# Patient Record
Sex: Female | Born: 1970 | Race: White | Hispanic: No | Marital: Married | Smoking: Current every day smoker
Health system: Southern US, Community
[De-identification: ages and names within clinical notes are randomized; demographics above are authoritative.]

## PROBLEM LIST (undated history)

## (undated) DIAGNOSIS — F329 Major depressive disorder, single episode, unspecified: Secondary | ICD-10-CM

## (undated) DIAGNOSIS — F32A Depression, unspecified: Secondary | ICD-10-CM

## (undated) DIAGNOSIS — C4490 Unspecified malignant neoplasm of skin, unspecified: Secondary | ICD-10-CM

## (undated) DIAGNOSIS — F419 Anxiety disorder, unspecified: Secondary | ICD-10-CM

## (undated) HISTORY — PX: ABDOMINAL HYSTERECTOMY: SHX81

## (undated) HISTORY — PX: TONSILLECTOMY: SUR1361

---

## 1997-05-02 ENCOUNTER — Emergency Department (HOSPITAL_COMMUNITY): Admission: EM | Admit: 1997-05-02 | Discharge: 1997-05-02 | Payer: Self-pay | Admitting: Emergency Medicine

## 1997-09-27 ENCOUNTER — Other Ambulatory Visit: Admission: RE | Admit: 1997-09-27 | Discharge: 1997-09-27 | Payer: Self-pay | Admitting: Obstetrics and Gynecology

## 1997-09-28 ENCOUNTER — Other Ambulatory Visit: Admission: RE | Admit: 1997-09-28 | Discharge: 1997-09-28 | Payer: Self-pay | Admitting: Internal Medicine

## 1997-10-05 ENCOUNTER — Ambulatory Visit (HOSPITAL_COMMUNITY): Admission: RE | Admit: 1997-10-05 | Discharge: 1997-10-05 | Payer: Self-pay | Admitting: *Deleted

## 1998-05-23 ENCOUNTER — Encounter: Payer: Self-pay | Admitting: Obstetrics and Gynecology

## 1998-05-23 ENCOUNTER — Ambulatory Visit (HOSPITAL_COMMUNITY): Admission: RE | Admit: 1998-05-23 | Discharge: 1998-05-23 | Payer: Self-pay | Admitting: Obstetrics and Gynecology

## 2005-05-20 ENCOUNTER — Encounter: Payer: Self-pay | Admitting: Obstetrics and Gynecology

## 2005-08-27 ENCOUNTER — Observation Stay: Payer: Self-pay | Admitting: Obstetrics & Gynecology

## 2005-09-24 ENCOUNTER — Ambulatory Visit: Payer: Self-pay | Admitting: Obstetrics & Gynecology

## 2005-10-01 ENCOUNTER — Observation Stay: Payer: Self-pay

## 2005-10-06 ENCOUNTER — Ambulatory Visit: Payer: Self-pay

## 2005-11-10 ENCOUNTER — Observation Stay: Payer: Self-pay | Admitting: Unknown Physician Specialty

## 2005-11-15 ENCOUNTER — Observation Stay: Payer: Self-pay | Admitting: Unknown Physician Specialty

## 2005-11-25 ENCOUNTER — Observation Stay: Payer: Self-pay | Admitting: Obstetrics & Gynecology

## 2005-11-30 ENCOUNTER — Observation Stay: Payer: Self-pay

## 2005-12-06 ENCOUNTER — Inpatient Hospital Stay: Payer: Self-pay | Admitting: Obstetrics & Gynecology

## 2006-05-06 ENCOUNTER — Encounter: Payer: Self-pay | Admitting: Obstetrics and Gynecology

## 2006-05-20 ENCOUNTER — Encounter: Payer: Self-pay | Admitting: Maternal & Fetal Medicine

## 2006-08-02 ENCOUNTER — Encounter: Payer: Self-pay | Admitting: Maternal & Fetal Medicine

## 2006-08-09 ENCOUNTER — Encounter: Payer: Self-pay | Admitting: Maternal & Fetal Medicine

## 2006-08-16 ENCOUNTER — Encounter: Payer: Self-pay | Admitting: Obstetrics and Gynecology

## 2006-08-30 ENCOUNTER — Observation Stay: Payer: Self-pay | Admitting: Obstetrics and Gynecology

## 2006-08-30 ENCOUNTER — Encounter: Payer: Self-pay | Admitting: Maternal & Fetal Medicine

## 2006-09-17 ENCOUNTER — Ambulatory Visit: Payer: Self-pay | Admitting: Unknown Physician Specialty

## 2006-09-21 ENCOUNTER — Ambulatory Visit: Payer: Self-pay

## 2006-09-27 ENCOUNTER — Encounter: Payer: Self-pay | Admitting: Maternal and Fetal Medicine

## 2006-09-28 ENCOUNTER — Inpatient Hospital Stay: Payer: Self-pay | Admitting: Unknown Physician Specialty

## 2008-06-05 ENCOUNTER — Ambulatory Visit: Payer: Self-pay

## 2008-06-12 ENCOUNTER — Ambulatory Visit: Payer: Self-pay

## 2012-05-07 ENCOUNTER — Emergency Department: Payer: Self-pay | Admitting: Emergency Medicine

## 2014-02-08 ENCOUNTER — Emergency Department: Payer: Self-pay | Admitting: Emergency Medicine

## 2014-02-08 LAB — CBC WITH DIFFERENTIAL/PLATELET
Basophil #: 0.1 10*3/uL (ref 0.0–0.1)
Basophil %: 1.1 %
Eosinophil #: 0.1 10*3/uL (ref 0.0–0.7)
Eosinophil %: 1.1 %
HCT: 37.6 % (ref 35.0–47.0)
HGB: 12.4 g/dL (ref 12.0–16.0)
Lymphocyte #: 1.6 10*3/uL (ref 1.0–3.6)
Lymphocyte %: 23.8 %
MCH: 28.4 pg (ref 26.0–34.0)
MCHC: 32.9 g/dL (ref 32.0–36.0)
MCV: 87 fL (ref 80–100)
Monocyte #: 0.4 x10 3/mm (ref 0.2–0.9)
Monocyte %: 6.3 %
Neutrophil #: 4.7 10*3/uL (ref 1.4–6.5)
Neutrophil %: 67.7 %
Platelet: 237 10*3/uL (ref 150–440)
RBC: 4.34 10*6/uL (ref 3.80–5.20)
RDW: 14.5 % (ref 11.5–14.5)
WBC: 6.9 10*3/uL (ref 3.6–11.0)

## 2014-02-08 LAB — COMPREHENSIVE METABOLIC PANEL
Albumin: 3.8 g/dL (ref 3.4–5.0)
Alkaline Phosphatase: 54 U/L
Anion Gap: 6 — ABNORMAL LOW (ref 7–16)
BUN: 12 mg/dL (ref 7–18)
Bilirubin,Total: 0.3 mg/dL (ref 0.2–1.0)
Calcium, Total: 9.1 mg/dL (ref 8.5–10.1)
Chloride: 106 mmol/L (ref 98–107)
Co2: 29 mmol/L (ref 21–32)
Creatinine: 1 mg/dL (ref 0.60–1.30)
EGFR (African American): >60
EGFR (Non-African Amer.): >60
Glucose: 92 mg/dL (ref 65–99)
Osmolality: 281 (ref 275–301)
Potassium: 3.6 mmol/L (ref 3.5–5.1)
SGOT(AST): 23 U/L (ref 15–37)
SGPT (ALT): 20 U/L
Sodium: 141 mmol/L (ref 136–145)
Total Protein: 7.5 g/dL (ref 6.4–8.2)

## 2014-02-08 LAB — URINALYSIS, COMPLETE
Bacteria: NONE SEEN
Bilirubin,UR: NEGATIVE
Glucose,UR: NEGATIVE mg/dL (ref 0–75)
Ketone: NEGATIVE
Leukocyte Esterase: NEGATIVE
Nitrite: NEGATIVE
Ph: 7 (ref 4.5–8.0)
Protein: NEGATIVE
RBC,UR: 3 /HPF (ref 0–5)
Specific Gravity: 1.009 (ref 1.003–1.030)
Squamous Epithelial: <1
WBC UR: <1 /HPF (ref 0–5)

## 2014-02-08 LAB — PREGNANCY, URINE: Pregnancy Test, Urine: NEGATIVE m[IU]/mL

## 2015-08-26 ENCOUNTER — Other Ambulatory Visit: Payer: Self-pay | Admitting: Obstetrics and Gynecology

## 2015-08-26 DIAGNOSIS — Z1231 Encounter for screening mammogram for malignant neoplasm of breast: Secondary | ICD-10-CM

## 2015-09-05 ENCOUNTER — Other Ambulatory Visit: Payer: Self-pay | Admitting: Obstetrics and Gynecology

## 2015-09-05 ENCOUNTER — Ambulatory Visit
Admission: RE | Admit: 2015-09-05 | Discharge: 2015-09-05 | Disposition: A | Discharge status: Home / Self Care | Payer: BC Managed Care – PPO | Source: Ambulatory Visit | Attending: Obstetrics and Gynecology | Admitting: Obstetrics and Gynecology

## 2015-09-05 DIAGNOSIS — Z1231 Encounter for screening mammogram for malignant neoplasm of breast: Secondary | ICD-10-CM | POA: Diagnosis not present

## 2017-01-01 ENCOUNTER — Other Ambulatory Visit: Payer: Self-pay

## 2017-01-01 ENCOUNTER — Ambulatory Visit
Admission: EM | Admit: 2017-01-01 | Discharge: 2017-01-01 | Disposition: A | Discharge status: Home / Self Care | Payer: BC Managed Care – PPO | Attending: Family Medicine | Admitting: Family Medicine

## 2017-01-01 DIAGNOSIS — J0100 Acute maxillary sinusitis, unspecified: Secondary | ICD-10-CM | POA: Diagnosis not present

## 2017-01-01 HISTORY — DX: Unspecified malignant neoplasm of skin, unspecified: C44.90

## 2017-01-01 HISTORY — DX: Major depressive disorder, single episode, unspecified: F32.9

## 2017-01-01 HISTORY — DX: Depression, unspecified: F32.A

## 2017-01-01 HISTORY — DX: Anxiety disorder, unspecified: F41.9

## 2017-01-01 MED ORDER — AMOXICILLIN 875 MG PO TABS: 875.0000 mg | ORAL_TABLET | Freq: Two times a day (BID) | ORAL | 0 refills | Status: DC

## 2017-01-01 NOTE — ED Triage Notes (Signed)
Pt reports 4 days of left sided facial pain and unsure if it coming from her sinus or if it is dental/gums. Denies nasal drainage. Denies fever.

## 2017-01-01 NOTE — ED Provider Notes (Signed)
[facility]    CSN: [id number] Arrival date & time: 01/01/17  1623     History   Chief Complaint Chief Complaint  Patient presents with  . Facial Pain    HPI Hannah Rodgers is a 46 y.o. female.   The history is provided by the patient.  URI  Presenting symptoms: congestion, ear pain and facial pain   Severity:  Moderate Onset quality:  Sudden Duration:  1 week Timing:  Constant Progression:  Worsening Chronicity:  New Relieved by:  Nothing Ineffective treatments:  OTC medications Associated symptoms: headaches and sinus pain   Risk factors: not elderly, no chronic cardiac disease, no chronic kidney disease, no chronic respiratory disease, no immunosuppression, no recent illness and no recent travel     Past Medical History:  Diagnosis Date  . Anxiety   . Depression   . Skin cancer     There are no active problems to display for this patient.   History reviewed. No pertinent surgical history.  OB History    No data available       Home Medications    Prior to Admission medications   Medication Sig Start Date End Date Taking? Authorizing Provider  medroxyPROGESTERone (DEPO-PROVERA) 150 MG/ML injection Inject 150 mg into the muscle every 3 (three) months.   Yes [provider]  sertraline (ZOLOFT) 100 MG tablet Take 150 mg by mouth daily.   Yes [provider]  amoxicillin (AMOXIL) 875 MG tablet Take 1 tablet (875 mg total) by mouth 2 (two) times daily. 01/01/17   Norval Gable, MD    Family History Family History  Problem Relation Age of Onset  . Hypertension Mother   . Hypertension Father     Social History Social History   Tobacco Use  . Smoking status: Current Every Day Smoker    Packs/day: 0.50    Types: Cigarettes  . Smokeless tobacco: Never Used  Substance Use Topics  . Alcohol use: No    Frequency: Never  . Drug use: No     Allergies   Patient has no known allergies.   Review of  Systems Review of Systems  HENT: Positive for congestion, ear pain and sinus pain.   Neurological: Positive for headaches.     Physical Exam Triage Vital Signs ED Triage Vitals  Enc Vitals Group     BP 01/01/17 1637 (!) 157/80     Pulse Rate 01/01/17 1637 72     Resp 01/01/17 1637 16     Temp 01/01/17 1637 98 F (36.7 C)     Temp Source 01/01/17 1637 Oral     SpO2 01/01/17 1637 100 %     Weight 01/01/17 1637 215 lb (97.5 kg)     Height 01/01/17 1637 5\' 6"  (1.676 m)     Head Circumference --      Peak Flow --      Pain Score 01/01/17 1638 6     Pain Loc --      Pain Edu? --      Excl. in [place]? --    No data found.  Updated Vital Signs BP (!) 157/80 (BP Location: Left Arm)   Pulse 72   Temp 98 F (36.7 C) (Oral)   Resp 16   Ht 5\' 6"  (1.676 m)   Wt 215 lb (97.5 kg)   SpO2 100%   BMI 34.70 kg/m   Visual Acuity Right Eye Distance:   Left Eye Distance:   Bilateral  Distance:    Right Eye Near:   Left Eye Near:    Bilateral Near:     Physical Exam  Constitutional: She appears well-developed and well-nourished. No distress.  HENT:  Head: Normocephalic and atraumatic.  Right Ear: Tympanic membrane, external ear and ear canal normal.  Left Ear: Tympanic membrane, external ear and ear canal normal.  Nose: Mucosal edema and rhinorrhea present. No nose lacerations, sinus tenderness, nasal deformity, septal deviation or nasal septal hematoma. No epistaxis.  No foreign bodies. Right sinus exhibits maxillary sinus tenderness and frontal sinus tenderness. Left sinus exhibits maxillary sinus tenderness and frontal sinus tenderness.  Mouth/Throat: Uvula is midline, oropharynx is clear and moist and mucous membranes are normal. No oropharyngeal exudate.  Eyes: Conjunctivae and EOM are normal. Pupils are equal, round, and reactive to light. Right eye exhibits no discharge. Left eye exhibits no discharge. No scleral icterus.  Neck: Normal range of motion. Neck supple. No thyromegaly  present.  Cardiovascular: Normal rate, regular rhythm and normal heart sounds.  Pulmonary/Chest: Effort normal and breath sounds normal. No respiratory distress. She has no wheezes. She has no rales.  Lymphadenopathy:    She has no cervical adenopathy.  Skin: She is not diaphoretic.  Nursing note and vitals reviewed.    UC Treatments / Results  Labs (all labs ordered are listed, but only abnormal results are displayed) Labs Reviewed - No data to display  EKG  EKG Interpretation None       Radiology No results found.  Procedures Procedures (including critical care time)  Medications Ordered in UC Medications - No data to display   Initial Impression / Assessment and Plan / UC Course  I have reviewed the triage vital signs and the nursing notes.  Pertinent labs & imaging results that were available during my care of the patient were reviewed by me and considered in my medical decision making (see chart for details).       Final Clinical Impressions(s) / UC Diagnoses   Final diagnoses:  Acute maxillary sinusitis, recurrence not specified    ED Discharge Orders        Ordered    amoxicillin (AMOXIL) 875 MG tablet  2 times daily     01/01/17 1724     1. diagnosis reviewed with patient 2. rx as per orders above; reviewed possible side effects, interactions, risks and benefits  3. Recommend supportive treatment with otc analgesics prn  4. Follow-up prn if symptoms worsen or don't improve   Controlled Substance Prescriptions [place] Controlled Substance Registry consulted? Not Applicable   Norval Gable, MD 01/01/17 [phone]

## 2018-01-19 DIAGNOSIS — C4490 Unspecified malignant neoplasm of skin, unspecified: Secondary | ICD-10-CM

## 2018-01-19 HISTORY — DX: Unspecified malignant neoplasm of skin, unspecified: C44.90

## 2018-06-20 ENCOUNTER — Other Ambulatory Visit: Payer: BC Managed Care – PPO

## 2018-06-24 NOTE — H&P (Signed)
Hannah Rodgers is a 48 y.o. female presenting with Pre Op Consulting  History of Present Illness: -Patient is here for a pre-op visit regarding her scheduled [place] for treatment of her ongoing AUB. Just slightly nervous patient.  -Body mass index is 37.09 kg/m.   Pertinent Hx: -Hx of AUB for several years, controlled with Depo -Failed progesterone -Is smoker, estrogen maintenance is contraindicated -Declines ablation, desires TLH  Surgical Hx: -C/S x 2, 1 SVD -BTL -D&C  Previous Workup: Pap:01/2018 neg EMBx:   Diagnosis:  ENDOMETRIUM, BIOPSY:  POLYPOID FRAGMENTS OF INACTIVE ENDOMETRIUM, MIXED WITH MINUTE FRAGMENTS OF  BENIGN ENDOCERVICAL GLANDS, MUCUS, AND BLOOD. NO HYPERPLASIA OR CARCINOMA.   TVUS: 02/2014 Saline US Uterus measured 10.6cm x8.9cmx 6.8cm with total volume of 337cm3 and endo thickness of 7.45mm  Two fibroids seen 1 rt lat=16.6 mm 2 lt lat=18.2 mm  bil ovs wnl   Past Medical History:  has a past medical history of Anemia, Benign hypertension, Carpal tunnel syndrome, Depression, Lumbago, and Plantar fibromatosis.  Past Surgical History:  has a past surgical history that includes Tubal ligation (Bilateral); Tonsillectomy (1992); Dilation and curettage of uterus; and Cesarean section (2007, 2008). Family History: family history includes Asthma in her father; Stroke in her maternal grandmother. Social History:  reports that she has been smoking. She has been smoking about 0.25 packs per day. She has never used smokeless tobacco. She reports that she does not drink alcohol or use drugs. OB/GYN History:          OB History    Gravida  4   Para  3   Term  3   Preterm      AB  1   Living  3     SAB  1   TAB      Ectopic      Molar      Multiple      Live Births           Allergies: has No Known Allergies. Medications:  Current Outpatient Medications:  .  albuterol 90 mcg/actuation inhaler, INHALE 2 PUFFS BY MOUTH EVERY 6  HOURS AS NEEDED FOR WHEEZE, Disp: 6.7 Inhaler, Rfl: 0 .  medroxyPROGESTERone (DEPO-PROVERA) 150 mg/mL injection, INJECT 1 ML (150 MG TOTAL) INTO THE MUSCLE EVERY 3 (THREE) MONTHS., Disp: 1 mL, Rfl: 3 .  montelukast (SINGULAIR) 10 mg tablet, Take 1 tablet (10 mg total) by mouth once daily, Disp: 90 tablet, Rfl: 3 .  sertraline (ZOLOFT) 100 MG tablet, Take 1.5 tablets (150 mg total) by mouth once daily, Disp: 135 tablet, Rfl: 3  Review of Systems: No SOB, no palpitations or chest pain, no new lower extremity edema, no nausea or vomiting or bowel or bladder complaints. See HPI for gyn specific ROS.  Exam:  BP 130/85   Pulse 85   Ht 167.6 cm (5\' 6" )   Wt (!) 104.2 kg (229 lb 12.8 oz)   BMI 37.09 kg/m   Constitutional:  General appearance: Well nourished, well developed female in no acute distress.  Neuro/psych:  Normal mood and affect. No gross motor deficits. Neck:  Supple, normal appearance.  Respiratory:  Normal respiratory effort, no use of accessory muscles CV: RRR Skin:  No visible rashes or external lesions Pelvic: tanner stage 5 ,              External genitalia: vulva /labia no lesions             Urethra: no prolapse  Vagina: normal physiologic d/c, laxity in vaginal walls             Cervix: no lesions, no cervical motion tenderness, good descent             Uterus: normal size shape and contour, non-tender             Adnexa: no mass,  non-tender               Rectovaginal: External wnl  Endometrial biopsy: The cervix was cleaned with betadine, topical Hurriciane spray applied, and a single tooth tenaculum is applied to the anterior cervix. The Pipelle catheter was placed into the endometrial cavity. It sounds to 8.5 cm and adequate tissue was removed. Patient tolerated the procedure well. Impression:   The primary encounter diagnosis was Pre-operative clearance. A diagnosis of Abnormal uterine bleeding (AUB), unspecified was also pertinent to this  visit.  Plan:   1. Pre-Operative Visit for [place] -Patient returns for a preoperative discussion regarding her plans to proceed with surgical treatment of her abnormal uterine bleeding by total laparoscopic hysterectomy with BS procedure.We may perform a cystoscopy to evaluate the urinary tract after the procedure.   -The patient and I discussed the technical aspects of the procedure including the potential for risks and complications. These include but are not limited to the risk of infection requiring post-operative antibiotics or further procedures. We talked about the risk of injury to adjacent organs including bladder, bowel, ureter, blood vessels or nerves. We talked about the need to convert to an open incision. We talked about the possible need for blood transfusion. We talked aboutpostop complications such asthromboembolic or cardiopulmonary complications.  -Her preoperative exam was completed and the appropriate consents were signed. She is scheduled to undergo this procedure in the near future. -All of her questions were answered.   -Handouts given

## 2018-06-28 ENCOUNTER — Encounter
Admission: RE | Admit: 2018-06-28 | Discharge: 2018-06-28 | Disposition: A | Discharge status: Home / Self Care | Payer: BC Managed Care – PPO | Source: Ambulatory Visit | Attending: Obstetrics and Gynecology | Admitting: Obstetrics and Gynecology

## 2018-06-28 ENCOUNTER — Other Ambulatory Visit: Payer: Self-pay

## 2018-06-28 ENCOUNTER — Encounter: Payer: Self-pay | Admitting: *Deleted

## 2018-06-28 DIAGNOSIS — Z01818 Encounter for other preprocedural examination: Secondary | ICD-10-CM | POA: Diagnosis present

## 2018-06-28 NOTE — Patient Instructions (Signed)
Your procedure is scheduled on: Monday 6/15 Report to Day Surgery.  [facility]) To find out your arrival time please call [phone] between 1PM - 3PM on Friday 6/12  Remember: Instructions that are not followed completely may result in serious medical risk,  up to and including death, or upon the discretion of your surgeon and anesthesiologist your  surgery may need to be rescheduled.     _X__ 1. Do not eat food after midnight the night before your procedure.                 No gum chewing or hard candies. You may drink clear liquids up to 2 hours                 before you are scheduled to arrive for your surgery- DO not drink clear                 liquids within 2 hours of the start of your surgery.                 Clear Liquids include:  water, apple juice without pulp, YOU MUST COMPLETE YOUR clear carbohydrate                 Drink PROVIDED 2 HOURS BEFORE YOUR ARRIVAL.  Clearfast of Gatorade, Black Coffee or Tea (Do not add                 anything to coffee or tea).  __X__2.  On the morning of surgery brush your teeth with toothpaste and water, you                may rinse your mouth with mouthwash if you wish.  Do not swallow any toothpaste of mouthwash.     _X__ 3.  No Alcohol for 24 hours before or after surgery.   _X__ 4.  Do Not Smoke or use e-cigarettes For 24 Hours Prior to Your Surgery.                 Do not use any chewable tobacco products for at least 6 hours prior to                 surgery.  ____  5.  Bring all medications with you on the day of surgery if instructed.   _x___  6.  Notify your doctor if there is any change in your medical condition      (cold, fever, infections).     Do not wear jewelry, make-up, hairpins, clips or nail polish. Do not wear lotions, powders, or perfumes. You may wear deodorant. Do not shave 48 hours prior to surgery. Men may shave face and neck. Do not bring valuables to the hospital.     [facility] is not responsible for any belongings or valuables.  Contacts, dentures or bridgework may not be worn into surgery. Leave your suitcase in the car. After surgery it may be brought to your room. For patients admitted to the hospital, discharge time is determined by your treatment team.   Patients discharged the day of surgery will not be allowed to drive home.   Please read over the following fact sheets that you were given:     __X__ Take these medicines the morning of surgery with A SIP OF WATER:    1. NONE  2.   3.   4.  5.  6.  ____ Fleet Enema (as directed)  _X___ Use CHG Soap as directed   See enclosed instructions.  ____ Use inhalers on the day of surgery  ____ Stop metformin 2 days prior to surgery    ____ Take 1/2 of usual insulin dose the night before surgery. No insulin the morning          of surgery.   ____ Stop Coumadin/Plavix/aspirin on   __X__ Stop Anti-inflammatories Ibuprofen, Aleve, Aspirin today    May take Tylenol   ____ Stop supplements until after surgery.    ____ Bring C-Pap to the hospital.   See enclosed Incentive Spirometer and Instructions.

## 2018-06-28 NOTE — Patient Instructions (Signed)
Your procedure is scheduled on: Monday 6/15 Report to Day Surgery. To find out your arrival time please call [phone] between 1PM - 3PM on Friday 07/01/18.  Remember: Instructions that are not followed completely may result in serious medical risk,  up to and including death, or upon the discretion of your surgeon and anesthesiologist your  surgery may need to be rescheduled.     _X__ 1. Do not eat food after midnight the night before your procedure.                 No gum chewing or hard candies. You may drink clear liquids up to 2 hours                 before you are scheduled to arrive for your surgery- DO not drink clear                 liquids within 2 hours of the start of your surgery.                 Clear Liquids include:  water, apple juice without pulp, YOU MUST COMPLETE clear carbohydrate                 drink PROVICED 2 HOURS BEFORE YOUR ARRIVAL. Clearfast of Gatorade, Black Coffee or Tea (Do not add                 anything to coffee or tea).  __X__2.  On the morning of surgery brush your teeth with toothpaste and water, you                may rinse your mouth with mouthwash if you wish.  Do not swallow any toothpaste of mouthwash.     _X__ 3.  No Alcohol for 24 hours before or after surgery.   _X__ 4.  Do Not Smoke or use e-cigarettes For 24 Hours Prior to Your Surgery.                 Do not use any chewable tobacco products for at least 6 hours prior to                 surgery.  ____  5.  Bring all medications with you on the day of surgery if instructed.   __x__  6.  Notify your doctor if there is any change in your medical condition      (cold, fever, infections).     Do not wear jewelry, make-up, hairpins, clips or nail polish. Do not wear lotions, powders, or perfumes. You may wear deodorant. Do not shave 48 hours prior to surgery. Men may shave face and neck. Do not bring valuables to the hospital.    [facility] is not responsible  for any belongings or valuables.  Contacts, dentures or bridgework may not be worn into surgery. Leave your suitcase in the car. After surgery it may be brought to your room. For patients admitted to the hospital, discharge time is determined by your treatment team.   Patients discharged the day of surgery will not be allowed to drive home.   Please read over the following fact sheets that you were given:  _x___ Take these medicines the morning of surgery with A SIP OF WATER:    1. none  2.   3.   4.  5.  6.  ____ Fleet Enema (as directed)   __x__ Use CHG Soap as directed  ____ Use  inhalers on the day of surgery  ____ Stop metformin 2 days prior to surgery    ____ Take 1/2 of usual insulin dose the night before surgery. No insulin the morning          of surgery. ____ Stop Coumadin/Plavix/aspirin on   __x__ Stop Anti-inflammatories No Advil, Ibuprofen, Aspirin until after the procedure   May take tylenol   ____ Stop supplements until after surgery.    ____ Bring C-Pap to the hospital.   Review Incentive Spirometer instructions.  Device included

## 2018-06-30 ENCOUNTER — Other Ambulatory Visit: Payer: Self-pay

## 2018-06-30 ENCOUNTER — Other Ambulatory Visit
Admission: RE | Admit: 2018-06-30 | Discharge: 2018-06-30 | Disposition: A | Discharge status: Home / Self Care | Payer: BC Managed Care – PPO | Source: Ambulatory Visit | Attending: Obstetrics and Gynecology | Admitting: Obstetrics and Gynecology

## 2018-06-30 DIAGNOSIS — Z1159 Encounter for screening for other viral diseases: Secondary | ICD-10-CM | POA: Diagnosis not present

## 2018-06-30 DIAGNOSIS — Z01812 Encounter for preprocedural laboratory examination: Secondary | ICD-10-CM | POA: Insufficient documentation

## 2018-06-30 LAB — BASIC METABOLIC PANEL
Anion gap: 9 (ref 5–15)
BUN: 16 mg/dL (ref 6–20)
CO2: 21 mmol/L — ABNORMAL LOW (ref 22–32)
Calcium: 9.1 mg/dL (ref 8.9–10.3)
Chloride: 111 mmol/L (ref 98–111)
Creatinine, Ser: 0.64 mg/dL (ref 0.44–1.00)
GFR calc Af Amer: >60 mL/min (ref >60)
GFR calc non Af Amer: >60 mL/min (ref >60)
Glucose, Bld: 127 mg/dL — ABNORMAL HIGH (ref 70–99)
Potassium: 3.7 mmol/L (ref 3.5–5.1)
Sodium: 141 mmol/L (ref 135–145)

## 2018-06-30 LAB — CBC
HCT: 42.5 % (ref 36.0–46.0)
Hemoglobin: 14.3 g/dL (ref 12.0–15.0)
MCH: 30.9 pg (ref 26.0–34.0)
MCHC: 33.6 g/dL (ref 30.0–36.0)
MCV: 91.8 fL (ref 80.0–100.0)
Platelets: 214 10*3/uL (ref 150–400)
RBC: 4.63 MIL/uL (ref 3.87–5.11)
RDW: 12.2 % (ref 11.5–15.5)
WBC: 8.3 10*3/uL (ref 4.0–10.5)
nRBC: 0 % (ref 0.0–0.2)

## 2018-06-30 LAB — TYPE AND SCREEN
ABO/RH(D): O POS
Antibody Screen: NEGATIVE

## 2018-07-01 LAB — NOVEL CORONAVIRUS, NAA (HOSP ORDER, SEND-OUT TO REF LAB; TAT 18-24 HRS): SARS-CoV-2, NAA: NOT DETECTED

## 2018-07-04 ENCOUNTER — Other Ambulatory Visit: Payer: Self-pay

## 2018-07-04 ENCOUNTER — Ambulatory Visit: Payer: BC Managed Care – PPO | Admitting: Anesthesiology

## 2018-07-04 ENCOUNTER — Ambulatory Visit
Admission: RE | Admit: 2018-07-04 | Discharge: 2018-07-04 | Disposition: A | Discharge status: Home / Self Care | Payer: BC Managed Care – PPO | Attending: Obstetrics and Gynecology | Admitting: Obstetrics and Gynecology

## 2018-07-04 ENCOUNTER — Encounter
Admission: RE | Disposition: A | Discharge status: Home / Self Care | Payer: Self-pay | Source: Home / Self Care | Attending: Obstetrics and Gynecology

## 2018-07-04 ENCOUNTER — Encounter: Payer: Self-pay | Admitting: *Deleted

## 2018-07-04 DIAGNOSIS — I10 Essential (primary) hypertension: Secondary | ICD-10-CM | POA: Diagnosis not present

## 2018-07-04 DIAGNOSIS — N800 Endometriosis of uterus: Secondary | ICD-10-CM | POA: Diagnosis not present

## 2018-07-04 DIAGNOSIS — N72 Inflammatory disease of cervix uteri: Secondary | ICD-10-CM | POA: Insufficient documentation

## 2018-07-04 DIAGNOSIS — Z79899 Other long term (current) drug therapy: Secondary | ICD-10-CM | POA: Diagnosis not present

## 2018-07-04 DIAGNOSIS — F17210 Nicotine dependence, cigarettes, uncomplicated: Secondary | ICD-10-CM | POA: Diagnosis not present

## 2018-07-04 DIAGNOSIS — F329 Major depressive disorder, single episode, unspecified: Secondary | ICD-10-CM | POA: Insufficient documentation

## 2018-07-04 DIAGNOSIS — Z793 Long term (current) use of hormonal contraceptives: Secondary | ICD-10-CM | POA: Insufficient documentation

## 2018-07-04 DIAGNOSIS — D259 Leiomyoma of uterus, unspecified: Secondary | ICD-10-CM | POA: Diagnosis not present

## 2018-07-04 DIAGNOSIS — N939 Abnormal uterine and vaginal bleeding, unspecified: Secondary | ICD-10-CM | POA: Diagnosis present

## 2018-07-04 HISTORY — PX: LAPAROSCOPIC HYSTERECTOMY: SHX1926

## 2018-07-04 HISTORY — PX: LAPAROSCOPIC BILATERAL SALPINGECTOMY: SHX5889

## 2018-07-04 HISTORY — PX: CYSTOSCOPY: SHX5120

## 2018-07-04 LAB — ABO/RH: ABO/RH(D): O POS

## 2018-07-04 LAB — POCT PREGNANCY, URINE: Preg Test, Ur: NEGATIVE

## 2018-07-04 SURGERY — HYSTERECTOMY, TOTAL, LAPAROSCOPIC
Anesthesia: General | Site: Abdomen

## 2018-07-04 MED ORDER — OXYCODONE HCL 5 MG/5ML PO SOLN: 5.0000 mg | Freq: Once | ORAL | Status: AC | PRN

## 2018-07-04 MED ORDER — ROCURONIUM BROMIDE 100 MG/10ML IV SOLN
INTRAVENOUS | Status: DC | PRN
  Administered 2018-07-04: 20 mg via INTRAVENOUS
  Administered 2018-07-04: 50 mg via INTRAVENOUS

## 2018-07-04 MED ORDER — LIDOCAINE HCL (CARDIAC) PF 100 MG/5ML IV SOSY
PREFILLED_SYRINGE | INTRAVENOUS | Status: DC | PRN
  Administered 2018-07-04: 100 mg via INTRAVENOUS

## 2018-07-04 MED ORDER — ACETAMINOPHEN 500 MG PO TABS: 1000.0000 mg | ORAL_TABLET | Freq: Four times a day (QID) | ORAL | 0 refills | Status: AC

## 2018-07-04 MED ORDER — FENTANYL CITRATE (PF) 100 MCG/2ML IJ SOLN
INTRAMUSCULAR | Status: AC
  Filled 2018-07-04: qty 4

## 2018-07-04 MED ORDER — GABAPENTIN 800 MG PO TABS: 800.0000 mg | ORAL_TABLET | Freq: Every day | ORAL | 0 refills | Status: DC

## 2018-07-04 MED ORDER — CEFAZOLIN SODIUM-DEXTROSE 2-4 GM/100ML-% IV SOLN
INTRAVENOUS | Status: AC
  Filled 2018-07-04: qty 100

## 2018-07-04 MED ORDER — ONDANSETRON HCL 4 MG/2ML IJ SOLN
INTRAMUSCULAR | Status: DC | PRN
  Administered 2018-07-04: 4 mg via INTRAVENOUS

## 2018-07-04 MED ORDER — OXYCODONE HCL 5 MG PO TABS
5.0000 mg | ORAL_TABLET | Freq: Once | ORAL | Status: AC | PRN
  Administered 2018-07-04: 5 mg via ORAL

## 2018-07-04 MED ORDER — MIDAZOLAM HCL 2 MG/2ML IJ SOLN
INTRAMUSCULAR | Status: AC
  Filled 2018-07-04: qty 2

## 2018-07-04 MED ORDER — OXYCODONE HCL 5 MG PO CAPS: 5.0000 mg | ORAL_CAPSULE | Freq: Four times a day (QID) | ORAL | 0 refills | Status: DC | PRN

## 2018-07-04 MED ORDER — GABAPENTIN 300 MG PO CAPS
300.0000 mg | ORAL_CAPSULE | ORAL | Status: AC
  Administered 2018-07-04: 300 mg via ORAL

## 2018-07-04 MED ORDER — PROPOFOL 10 MG/ML IV BOLUS
INTRAVENOUS | Status: AC
  Filled 2018-07-04: qty 20

## 2018-07-04 MED ORDER — OXYCODONE HCL 5 MG PO TABS
ORAL_TABLET | ORAL | Status: AC
  Administered 2018-07-04: 5 mg via ORAL
  Filled 2018-07-04: qty 1

## 2018-07-04 MED ORDER — PROPOFOL 10 MG/ML IV BOLUS
INTRAVENOUS | Status: DC | PRN
  Administered 2018-07-04: 200 mg via INTRAVENOUS

## 2018-07-04 MED ORDER — PHENYLEPHRINE HCL (PRESSORS) 10 MG/ML IV SOLN
INTRAVENOUS | Status: DC | PRN
  Administered 2018-07-04: 200 ug via INTRAVENOUS

## 2018-07-04 MED ORDER — SUGAMMADEX SODIUM 200 MG/2ML IV SOLN
INTRAVENOUS | Status: DC | PRN
  Administered 2018-07-04: 200 mg via INTRAVENOUS

## 2018-07-04 MED ORDER — LACTATED RINGERS IV SOLN
INTRAVENOUS | Status: DC
  Administered 2018-07-04: 09:00:00 via INTRAVENOUS
  Administered 2018-07-04: 125 mL/h via INTRAVENOUS

## 2018-07-04 MED ORDER — DOCUSATE SODIUM 100 MG PO CAPS: 100.0000 mg | ORAL_CAPSULE | Freq: Two times a day (BID) | ORAL | 0 refills | Status: DC

## 2018-07-04 MED ORDER — BUPIVACAINE HCL (PF) 0.5 % IJ SOLN
INTRAMUSCULAR | Status: AC
  Filled 2018-07-04: qty 30

## 2018-07-04 MED ORDER — MIDAZOLAM HCL 2 MG/2ML IJ SOLN
INTRAMUSCULAR | Status: DC | PRN
  Administered 2018-07-04: 2 mg via INTRAVENOUS

## 2018-07-04 MED ORDER — METHYLENE BLUE 0.5 % INJ SOLN
INTRAVENOUS | Status: AC
  Filled 2018-07-04: qty 10

## 2018-07-04 MED ORDER — IBUPROFEN 800 MG PO TABS: 800.0000 mg | ORAL_TABLET | Freq: Three times a day (TID) | ORAL | 1 refills | Status: AC

## 2018-07-04 MED ORDER — EPHEDRINE SULFATE 50 MG/ML IJ SOLN
INTRAMUSCULAR | Status: DC | PRN
  Administered 2018-07-04: 10 mg via INTRAVENOUS

## 2018-07-04 MED ORDER — ACETAMINOPHEN 500 MG PO TABS
ORAL_TABLET | ORAL | Status: AC
  Administered 2018-07-04: 06:00:00 1000 mg via ORAL
  Filled 2018-07-04: qty 2

## 2018-07-04 MED ORDER — DEXAMETHASONE SODIUM PHOSPHATE 10 MG/ML IJ SOLN
INTRAMUSCULAR | Status: DC | PRN
  Administered 2018-07-04: 8 mg via INTRAVENOUS

## 2018-07-04 MED ORDER — FENTANYL CITRATE (PF) 100 MCG/2ML IJ SOLN
25.0000 ug | INTRAMUSCULAR | Status: DC | PRN
  Administered 2018-07-04 (×4): 25 ug via INTRAVENOUS

## 2018-07-04 MED ORDER — SEVOFLURANE IN SOLN
RESPIRATORY_TRACT | Status: AC
  Filled 2018-07-04: qty 250

## 2018-07-04 MED ORDER — FAMOTIDINE 20 MG PO TABS
20.0000 mg | ORAL_TABLET | Freq: Once | ORAL | Status: AC
  Administered 2018-07-04: 20 mg via ORAL

## 2018-07-04 MED ORDER — GABAPENTIN 300 MG PO CAPS
ORAL_CAPSULE | ORAL | Status: AC
  Administered 2018-07-04: 300 mg via ORAL
  Filled 2018-07-04: qty 1

## 2018-07-04 MED ORDER — FENTANYL CITRATE (PF) 100 MCG/2ML IJ SOLN
INTRAMUSCULAR | Status: DC | PRN
  Administered 2018-07-04 (×4): 50 ug via INTRAVENOUS

## 2018-07-04 MED ORDER — CEFAZOLIN SODIUM-DEXTROSE 2-4 GM/100ML-% IV SOLN
2.0000 g | INTRAVENOUS | Status: AC
  Administered 2018-07-04: 2 g via INTRAVENOUS

## 2018-07-04 MED ORDER — FENTANYL CITRATE (PF) 100 MCG/2ML IJ SOLN
INTRAMUSCULAR | Status: AC
  Administered 2018-07-04: 25 ug via INTRAVENOUS
  Filled 2018-07-04: qty 2

## 2018-07-04 MED ORDER — FAMOTIDINE 20 MG PO TABS
ORAL_TABLET | ORAL | Status: AC
  Administered 2018-07-04: 06:00:00 20 mg via ORAL
  Filled 2018-07-04: qty 1

## 2018-07-04 MED ORDER — ACETAMINOPHEN 500 MG PO TABS
1000.0000 mg | ORAL_TABLET | ORAL | Status: AC
  Administered 2018-07-04: 1000 mg via ORAL

## 2018-07-04 MED ORDER — BUPIVACAINE HCL 0.5 % IJ SOLN
INTRAMUSCULAR | Status: DC | PRN
  Administered 2018-07-04: 10 mL

## 2018-07-04 MED ORDER — LABETALOL HCL 5 MG/ML IV SOLN
INTRAVENOUS | Status: DC | PRN
  Administered 2018-07-04: 5 mg via INTRAVENOUS

## 2018-07-04 SURGICAL SUPPLY — 82 items
ADH SKN CLS APL DERMABOND .7 (GAUZE/BANDAGES/DRESSINGS) ×3
APL PRP STRL LF DISP 70% ISPRP (MISCELLANEOUS) ×3
APL SRG 38 LTWT LNG FL B (MISCELLANEOUS) ×3
APL SWBSTK 6 STRL LF DISP (MISCELLANEOUS) ×3
APPLICATOR ARISTA FLEXITIP XL (MISCELLANEOUS) ×2 IMPLANT
APPLICATOR COTTON TIP 6 STRL (MISCELLANEOUS) IMPLANT
APPLICATOR COTTON TIP 6IN STRL (MISCELLANEOUS) ×5
BAG SPEC RTRVL LRG 6X4 10 (ENDOMECHANICALS)
BAG URINE DRAINAGE (UROLOGICAL SUPPLIES) ×10 IMPLANT
BLADE SURG SZ11 CARB STEEL (BLADE) ×5 IMPLANT
CATH FOLEY 2WAY  5CC 16FR (CATHETERS) ×2
CATH FOLEY 2WAY 5CC 16FR (CATHETERS) ×3
CATH URTH 16FR FL 2W BLN LF (CATHETERS) ×3 IMPLANT
CHLORAPREP W/TINT 26 (MISCELLANEOUS) ×5 IMPLANT
CLOSURE WOUND 1/4X4 (GAUZE/BANDAGES/DRESSINGS) ×1
CORD MONOPOLAR M/FML 12FT (MISCELLANEOUS) ×5 IMPLANT
COUNTER NEEDLE 20/40 LG (NEEDLE) ×5 IMPLANT
COVER LIGHT HANDLE STERIS (MISCELLANEOUS) ×10 IMPLANT
COVER WAND RF STERILE (DRAPES) ×5 IMPLANT
DERMABOND ADVANCED (GAUZE/BANDAGES/DRESSINGS) ×2
DERMABOND ADVANCED .7 DNX12 (GAUZE/BANDAGES/DRESSINGS) ×3 IMPLANT
DEVICE SUTURE ENDOST 10MM (ENDOMECHANICALS) ×2 IMPLANT
DRAPE GENERAL ENDO 106X123.5 (DRAPES) ×5 IMPLANT
DRAPE LEGGINS SURG 28X43 STRL (DRAPES) ×5 IMPLANT
DRAPE STERI POUCH LG 24X46 STR (DRAPES) ×5 IMPLANT
DRAPE UNDER BUTTOCK W/FLU (DRAPES) ×5 IMPLANT
DRSG TEGADERM 2-3/8X2-3/4 SM (GAUZE/BANDAGES/DRESSINGS) ×15 IMPLANT
GAUZE 4X4 16PLY RFD (DISPOSABLE) ×2 IMPLANT
GLOVE BIO SURGEON STRL SZ7 (GLOVE) ×15 IMPLANT
GLOVE INDICATOR 7.5 STRL GRN (GLOVE) ×5 IMPLANT
GOWN STRL REUS W/ TWL LRG LVL3 (GOWN DISPOSABLE) ×6 IMPLANT
GOWN STRL REUS W/ TWL XL LVL3 (GOWN DISPOSABLE) ×3 IMPLANT
GOWN STRL REUS W/TWL LRG LVL3 (GOWN DISPOSABLE) ×10
GOWN STRL REUS W/TWL XL LVL3 (GOWN DISPOSABLE) ×5
GRASPER SUT TROCAR 14GX15 (MISCELLANEOUS) ×2 IMPLANT
HEMOSTAT ARISTA ABSORB 1G (HEMOSTASIS) ×2 IMPLANT
IRRIGATION STRYKERFLOW (MISCELLANEOUS) ×3 IMPLANT
IRRIGATOR STRYKERFLOW (MISCELLANEOUS) ×5
IV NS 1000ML (IV SOLUTION) ×5
IV NS 1000ML BAXH (IV SOLUTION) ×3 IMPLANT
KIT PINK PAD W/HEAD ARE REST (MISCELLANEOUS) ×5
KIT PINK PAD W/HEAD ARM REST (MISCELLANEOUS) ×3 IMPLANT
KIT TURNOVER CYSTO (KITS) ×5 IMPLANT
LABEL OR SOLS (LABEL) ×5 IMPLANT
LIGASURE LAP MARYLAND 5MM 37CM (ELECTROSURGICAL) IMPLANT
LIGASURE VESSEL 5MM BLUNT TIP (ELECTROSURGICAL) IMPLANT
MANIPULATOR VCARE LG CRV RETR (MISCELLANEOUS) IMPLANT
MANIPULATOR VCARE SML CRV RETR (MISCELLANEOUS) ×2 IMPLANT
MANIPULATOR VCARE STD CRV RETR (MISCELLANEOUS) IMPLANT
NDL FILTER BLUNT 18X1 1/2 (NEEDLE) ×3 IMPLANT
NEEDLE FILTER BLUNT 18X 1/2SAF (NEEDLE) ×2
NEEDLE FILTER BLUNT 18X1 1/2 (NEEDLE) ×3 IMPLANT
NS IRRIG 500ML POUR BTL (IV SOLUTION) ×5 IMPLANT
OCCLUDER COLPOPNEUMO (BALLOONS) ×5 IMPLANT
PACK GYN LAPAROSCOPIC (MISCELLANEOUS) ×5 IMPLANT
PAD OB MATERNITY 4.3X12.25 (PERSONAL CARE ITEMS) ×5 IMPLANT
PAD PREP 24X41 OB/GYN DISP (PERSONAL CARE ITEMS) ×5 IMPLANT
POUCH SPECIMEN RETRIEVAL 10MM (ENDOMECHANICALS) IMPLANT
SCISSORS METZENBAUM CVD 33 (INSTRUMENTS) ×2 IMPLANT
SET CYSTO W/LG BORE CLAMP LF (SET/KITS/TRAYS/PACK) ×2 IMPLANT
SET TUBE SMOKE EVAC HIGH FLOW (TUBING) ×5 IMPLANT
SLEEVE ENDOPATH XCEL 5M (ENDOMECHANICALS) ×5 IMPLANT
SPONGE GAUZE 2X2 8PLY STER LF (GAUZE/BANDAGES/DRESSINGS) ×2
SPONGE GAUZE 2X2 8PLY STRL LF (GAUZE/BANDAGES/DRESSINGS) ×8 IMPLANT
STRIP CLOSURE SKIN 1/4X4 (GAUZE/BANDAGES/DRESSINGS) ×4 IMPLANT
SUT ENDO VLOC 180-0-8IN (SUTURE) ×2 IMPLANT
SUT MNCRL 4-0 (SUTURE) ×5
SUT MNCRL 4-0 27XMFL (SUTURE) ×3
SUT MNCRL AB 4-0 PS2 18 (SUTURE) ×5 IMPLANT
SUT VIC AB 0 CT1 36 (SUTURE) ×10 IMPLANT
SUT VIC AB 2-0 UR6 27 (SUTURE) ×5 IMPLANT
SUT VIC AB 4-0 SH 27 (SUTURE) ×5
SUT VIC AB 4-0 SH 27XANBCTRL (SUTURE) ×3 IMPLANT
SUTURE MNCRL 4-0 27XMF (SUTURE) ×3 IMPLANT
SYR 10ML LL (SYRINGE) ×5 IMPLANT
SYR 50ML LL SCALE MARK (SYRINGE) ×5 IMPLANT
SYR 5ML LL (SYRINGE) ×5 IMPLANT
TOWEL OR 17X26 4PK STRL BLUE (TOWEL DISPOSABLE) ×2 IMPLANT
TROCAR ENDO BLADELESS 11MM (ENDOMECHANICALS) ×2 IMPLANT
TROCAR XCEL NON-BLD 5MMX100MML (ENDOMECHANICALS) ×5 IMPLANT
TUBING ART PRESS 48 MALE/FEM (TUBING) IMPLANT
TUBING EVAC SMOKE HEATED PNEUM (TUBING) ×5 IMPLANT

## 2018-07-04 NOTE — Interval H&P Note (Signed)
History and Physical Interval Note:  07/04/2018 7:40 AM  Hannah Rodgers  has presented today for surgery, with the diagnosis of abnormal uterine bleeding.  The various methods of treatment have been discussed with the patient and family. After consideration of risks, benefits and other options for treatment, the patient has consented to  Procedure(s): HYSTERECTOMY TOTAL LAPAROSCOPIC (N/A) LAPAROSCOPIC BILATERAL SALPINGECTOMY (Bilateral) as a surgical intervention.  The patient's history has been reviewed, patient examined, no change in status, stable for surgery.  I have reviewed the patient's chart and labs.  Questions were answered to the patient's satisfaction.     Benjaman Kindler

## 2018-07-04 NOTE — Anesthesia Postprocedure Evaluation (Signed)
Anesthesia Post Note  Patient: RASHUNDA PASSON  Procedure(s) Performed: HYSTERECTOMY TOTAL LAPAROSCOPIC (N/A Abdomen) LAPAROSCOPIC BILATERAL SALPINGECTOMY (Bilateral Abdomen) CYSTOSCOPY (N/A )  Patient location during evaluation: PACU Anesthesia Type: General Level of consciousness: awake and alert Pain management: pain level controlled Vital Signs Assessment: post-procedure vital signs reviewed and stable Respiratory status: spontaneous breathing, nonlabored ventilation, respiratory function stable and patient connected to nasal cannula oxygen Cardiovascular status: blood pressure returned to baseline and stable Postop Assessment: no apparent nausea or vomiting Anesthetic complications: no     Last Vitals:  Vitals:   07/04/18 1050 07/04/18 1059  BP: 129/75 131/70  Pulse: 72 71  Resp: 12 16  Temp: 36.5 C 36.4 C  SpO2: 96% 96%    Last Pain:  Vitals:   07/04/18 1059  TempSrc: Temporal  PainSc: 5                  Precious Haws Piscitello

## 2018-07-04 NOTE — Op Note (Signed)
Fujiko Mellody Memos PROCEDURE DATE: 07/04/2018  PREOPERATIVE DIAGNOSIS: AUB POSTOPERATIVE DIAGNOSIS: The same PROCEDURE: Total laparoscopic hysterectomy, bilateral salpingectomy, cystoscopy SURGEON:  Dr. Benjaman Kindler ASSISTANT: Dr. Vikki Ports Ward  Anesthesiologist:  Anesthesiologist: Piscitello, Precious Haws, MD CRNA: Nelda Marseille, CRNA; Philbert Riser, CRNA  INDICATIONS: 48 y.o.  here for definitive surgical management secondary to the indications listed under preoperative diagnoses; please see preoperative note for further details.  Risks of surgery were discussed with the patient including but not limited to: bleeding which may require transfusion or reoperation; infection which may require antibiotics; injury to bowel, bladder, ureters or other surrounding organs; need for additional procedures; thromboembolic phenomenon, incisional problems and other postoperative/anesthesia complications. Written informed consent was obtained.    FINDINGS:  Enlarged boggy uterus, normal small cervix, normal bilateral tubes and ovaries   ANESTHESIA:    General INTRAVENOUS FLUIDS:1000  ml ESTIMATED BLOOD LOSS:50 ml URINE OUTPUT: 200 ml   SPECIMENS: Uterus, cervix, bilateral fallopian tubes  COMPLICATIONS: None immediate  PROCEDURE IN DETAIL:  The patient received prophalactic intravenous antibiotics and had sequential compression devices applied to her lower extremities while in the preoperative area.  She was then taken to the operating room where general anesthesia was administered and was found to be adequate.  She was placed in the dorsal lithotomy position, and was prepped and draped in a sterile manner.  A formal time out was performed with all team members present and in agreement.  A V-care uterine manipulator was placed at this time.  A Foley catheter was inserted into her bladder and attached to constant drainage. Attention was turned to the abdomen and 0.5% Marcaine infused subq. A 41mm umbilical  incision was made with the scalpel.  The Optiview 5-mm trocar and sleeve were then advanced without difficulty with the laparoscope under direct visualization into the abdomen.  The abdomen was then insufflated with carbon dioxide gas and adequate pneumoperitoneum was obtained.  A survey of the patient's pelvis and abdomen revealed the findings above.  Bilateral lower quadrant ports (5 mm on the right and 11 mm on the left) were then placed under direct visualization.  The pelvis was then carefully examined.  Attention was turned to the fallopian tubes; these were freed from the underlying mesosalpinx and the uterine attachments using the Ligasure device.  The bilateral round and broad ligaments were then clamped and transected with the Ligasure device.  The uterine artery was then skeletonized and a bladder flap was created.  The ureters were noted to be safely away from the area of dissection.  The bladder was then bluntly dissected off the lower uterine segment.  Scar tissue here was carefully avoided  At this point, attention was turned to the uterine vessels, which were clamped and cauterized using the Ligasure on the left, and then the right. After the uterine blood flow at the level of the internal os was controlled, both arteries were cut with the Ligasure.  Good hemostasis was noted overall.  The uterosacral and cardinal ligaments were clamped, cut and ligated bilaterally .  Attention was then turned to the cervicovaginal junction, and monopolar scissors were used to transect the cervix from the surrounding vagina using the ring of the V-care as a guide. This was done circumferentially allowing total hysterectomy.  The uterus was then removed from the vagina and the vaginal cuff incision was then closed with running V-loc suture.  Overall excellent hemostasis was noted.    Attention was returned to the abdomen.The ureters were reexamined  bilaterally and were pulsating normally. The abdominal pressure  was reduced and hemostasis was confirmed.   Cystoscopy showed bilateral vigorous ureteral jets.  No stitches were visualized in the bladder during cystoscopy.  The 47mm port fascia was closed with a vertical mattress with 0-Vicryl, using the cone closure system. All trocars were removed under direct visualization, and the abdomen was desufflated.  All skin incisions were closed with 4-0 Vicryl subcuticular stitches and Dermabond. The patient tolerated the procedures well.  All instruments, needles, and sponge counts were correct x 2. The patient was taken to the recovery room awake, extubated and in stable condition.   PostERAS and plan for d/c today.

## 2018-07-04 NOTE — Anesthesia Post-op Follow-up Note (Signed)
Anesthesia QCDR form completed.        

## 2018-07-04 NOTE — Anesthesia Preprocedure Evaluation (Signed)
Anesthesia Evaluation  Patient identified by MRN, date of birth, ID band Patient awake    Reviewed: Allergy & Precautions, H&P , NPO status , Patient's Chart, lab work & pertinent test results  History of Anesthesia Complications Negative for: history of anesthetic complications  Airway Mallampati: III  TM Distance: >3 FB Neck ROM: full    Dental  (+) Chipped, Poor Dentition   Pulmonary neg shortness of breath, Current Smoker,           Cardiovascular Exercise Tolerance: Good (-) angina(-) Past MI and (-) DOE negative cardio ROS       Neuro/Psych negative neurological ROS  negative psych ROS   GI/Hepatic negative GI ROS, Neg liver ROS, neg GERD  ,  Endo/Other  negative endocrine ROS  Renal/GU      Musculoskeletal   Abdominal   Peds  Hematology negative hematology ROS (+)   Anesthesia Other Findings Past Medical History: No date: Anxiety No date: Depression 2020: Skin cancer     Comment:  nose, arms  Past Surgical History: No date: CESAREAN SECTION     Comment:  x2 No date: TONSILLECTOMY  BMI    Body Mass Index: 35.99 kg/m      Reproductive/Obstetrics negative OB ROS                             Anesthesia Physical Anesthesia Plan  ASA: II  Anesthesia Plan: General ETT   Post-op Pain Management:    Induction: Intravenous  PONV Risk Score and Plan: Ondansetron, Dexamethasone, Midazolam and Treatment may vary due to age or medical condition  Airway Management Planned: Oral ETT  Additional Equipment:   Intra-op Plan:   Post-operative Plan: Extubation in OR  Informed Consent: I have reviewed the patients History and Physical, chart, labs and discussed the procedure including the risks, benefits and alternatives for the proposed anesthesia with the patient or authorized representative who has indicated his/her understanding and acceptance.     Dental Advisory  Given  Plan Discussed with: Anesthesiologist, CRNA and Surgeon  Anesthesia Plan Comments: (Patient consented for risks of anesthesia including but not limited to:  - adverse reactions to medications - damage to teeth, lips or other oral mucosa - sore throat or hoarseness - Damage to heart, brain, lungs or loss of life  Patient voiced understanding.)        Anesthesia Quick Evaluation

## 2018-07-04 NOTE — Discharge Instructions (Signed)
Discharge instructions after   total laparoscopic hysterectomy   For the next three days, take ibuprofen and acetaminophen on a schedule, every 8 hours. You can take them together or you can intersperse them, and take one every four hours. I also gave you gabapentin for nighttime, to help you sleep and also to control pain. Take gabapentin medicines at night for at least the next 3 nights. You also have a narcotic, oxycodone, to take as needed if the above medicines don't help.  Postop constipation is a major cause of pain. Stay well hydrated, walk as you tolerate, and take over the counter senna as well as stool softeners if you need them.    Signs and Symptoms to Report Call our office at [phone] if you have any of the following.  . Fever over 100.4 degrees or higher . Severe stomach pain not relieved with pain medications . Bright red bleeding that's heavier than a period that does not slow with rest . To go the bathroom a lot (frequency), you can't hold your urine (urgency), or it hurts when you empty your bladder (urinate) . Chest pain . Shortness of breath . Pain in the calves of your legs . Severe nausea and vomiting not relieved with anti-nausea medications . Signs of infection around your wounds, such as redness, hot to touch, swelling, green/yellow drainage (like pus), bad smelling discharge . Any concerns  What You Can Expect after Surgery . You may see some pink tinged, bloody fluid and bruising around the wound. This is normal. . You may notice shoulder and neck pain. This is caused by the gas used during surgery to expand your abdomen so your surgeon could get to the uterus easier. . You may have a sore throat because of the tube in your mouth during general anesthesia. This will go away in 2 to 3 days. . You may have some stomach cramps. . You may notice spotting on your panties. . You may have pain around the incision sites.   Activities after Your  Discharge Follow these guidelines to help speed your recovery at home: . Do the coughing and deep breathing as you did in the hospital for 2 weeks. Use the small blue breathing device, called the incentive spirometer for 2 weeks. . Don't drive if you are in pain or taking narcotic pain medicine. You may drive when you can safely slam on the brakes, turn the wheel forcefully, and rotate your torso comfortably. This is typically 1-2 weeks. Practice in a parking lot or side street prior to attempting to drive regularly.  . Ask others to help with household chores for 4 weeks. . Do not lift anything heavier that 10 pounds for 4-6 weeks. This includes pets, children, and groceries. . Don't do strenuous activities, exercises, or sports like vacuuming, tennis, squash, etc. until your doctor says it is safe to do so. ---Maintain pelvic rest for 8 weeks. This means nothing in the vagina or rectum at all (no douching, tampons, intercourse) for 8 weeks.  . Walk as you feel able. Rest often since it may take two or three weeks for your energy level to return to normal.  . You may climb stairs . Avoid constipation:   -Eat fruits, vegetables, and whole grains. Eat small meals as your appetite will take time to return to normal.   -Drink 6 to 8 glasses of water each day unless your doctor has told you to limit your fluids.   -Use a laxative   or stool softener as needed if constipation becomes a problem. You may take Miralax, metamucil, Citrucil, Colace, Senekot, FiberCon, etc. If this does not relieve the constipation, try two tablespoons of Milk Of Magnesia every 8 hours until your bowels move.  . You may shower. Gently wash the wounds with a mild soap and water. Pat dry. . Do not get in a hot tub, swimming pool, etc. for 6 weeks. . Do not use lotions, oils, powders on the wounds. . Do not douche, use tampons, or have sex until your doctor says it is okay. . Take your pain medicine when you need it. The medicine  may not work as well if the pain is bad.  Take the medicines you were taking before surgery. Other medications you will need are pain medications and possibly constipation and nausea medications (Zofran).    AMBULATORY SURGERY  DISCHARGE INSTRUCTIONS   1) The drugs that you were given will stay in your system until tomorrow so for the next 24 hours you should not:  A) Drive an automobile B) Make any legal decisions C) Drink any alcoholic beverage   2) You may resume regular meals tomorrow.  Today it is better to start with liquids and gradually work up to solid foods.  You may eat anything you prefer, but it is better to start with liquids, then soup and crackers, and gradually work up to solid foods.   3) Please notify your doctor immediately if you have any unusual bleeding, trouble breathing, redness and pain at the surgery site, drainage, fever, or pain not relieved by medication.    4) Additional Instructions:        Please contact your physician with any problems or Same Day Surgery at [phone], Monday through Friday 6 am to 4 pm, or [place] at [address] Main number at [phone]. 

## 2018-07-04 NOTE — Anesthesia Procedure Notes (Signed)
Procedure Name: Intubation Date/Time: 07/04/2018 7:52 AM Performed by: Philbert Riser, CRNA Pre-anesthesia Checklist: Patient identified, Emergency Drugs available, Suction available, Patient being monitored and Timeout performed Patient Re-evaluated:Patient Re-evaluated prior to induction Oxygen Delivery Method: Circle system utilized and Simple face mask Preoxygenation: Pre-oxygenation with 100% oxygen Induction Type: IV induction Ventilation: Mask ventilation without difficulty Laryngoscope Size: Mac and 3 Grade View: Grade III Tube type: Oral Tube size: 7.0 mm Number of attempts: 1 Airway Equipment and Method: Stylet Placement Confirmation: ETT inserted through vocal cords under direct vision,  positive ETCO2 and breath sounds checked- equal and bilateral

## 2018-07-04 NOTE — Transfer of Care (Signed)
Immediate Anesthesia Transfer of Care Note  Patient: Hannah Rodgers  Procedure(s) Performed: HYSTERECTOMY TOTAL LAPAROSCOPIC (N/A Abdomen) LAPAROSCOPIC BILATERAL SALPINGECTOMY (Bilateral Abdomen) CYSTOSCOPY (N/A )  Patient Location: PACU  Anesthesia Type:General  Level of Consciousness: sedated  Airway & Oxygen Therapy: Patient Spontanous Breathing and Patient connected to face mask oxygen  Post-op Assessment: Report given to RN and Post -op Vital signs reviewed and stable  Post vital signs: Reviewed and stable  Last Vitals:  Vitals Value Taken Time  BP 129/56 07/04/18 1005  Temp 36 C 07/04/18 1005  Pulse 70 07/04/18 1007  Resp 13 07/04/18 1007  SpO2 100 % 07/04/18 1007  Vitals shown include unvalidated device data.  Last Pain:  Vitals:   07/04/18 0615  TempSrc: Tympanic  PainSc: 0-No pain         Complications: No apparent anesthesia complications

## 2018-07-05 ENCOUNTER — Encounter: Payer: Self-pay | Admitting: Obstetrics and Gynecology

## 2018-07-05 NOTE — Anesthesia Postprocedure Evaluation (Signed)
Anesthesia Post Note  Patient: Hannah Rodgers  Procedure(s) Performed: HYSTERECTOMY TOTAL LAPAROSCOPIC (N/A Abdomen) LAPAROSCOPIC BILATERAL SALPINGECTOMY (Bilateral Abdomen) CYSTOSCOPY (N/A )  Patient location during evaluation: PACU Anesthesia Type: General Level of consciousness: awake and alert Pain management: pain level controlled Vital Signs Assessment: post-procedure vital signs reviewed and stable Respiratory status: spontaneous breathing, nonlabored ventilation, respiratory function stable and patient connected to nasal cannula oxygen Cardiovascular status: blood pressure returned to baseline and stable Postop Assessment: no apparent nausea or vomiting Anesthetic complications: no     Last Vitals:  Vitals:   07/04/18 1059 07/04/18 1411  BP: 131/70 (!) 148/71  Pulse: 71 74  Resp: 16 18  Temp: 36.4 C   SpO2: 96% 98%    Last Pain:  Vitals:   07/04/18 1411  TempSrc:   PainSc: 4                  Precious Haws Piscitello

## 2018-07-08 LAB — SURGICAL PATHOLOGY

## 2019-03-18 ENCOUNTER — Ambulatory Visit: Payer: BC Managed Care – PPO | Attending: Internal Medicine

## 2019-03-18 ENCOUNTER — Ambulatory Visit: Payer: BC Managed Care – PPO

## 2019-03-18 DIAGNOSIS — Z23 Encounter for immunization: Secondary | ICD-10-CM | POA: Insufficient documentation

## 2019-03-18 NOTE — Progress Notes (Signed)
   Covid-19 Vaccination Clinic  Name:  Hannah Rodgers    MRN: [id number] DOB: 1970/02/04  03/18/2019  [name] was observed post Covid-19 immunization for 15 minutes without incidence. She was provided with Vaccine Information Sheet and instruction to access the V-Safe system.   [name] was instructed to call 911 with any severe reactions post vaccine: Marland Kitchen Difficulty breathing  . Swelling of your face and throat  . A fast heartbeat  . A bad rash all over your body  . Dizziness and weakness    Immunizations Administered    Name Date Dose VIS Date Route   Moderna COVID-19 Vaccine 03/18/2019  9:20 AM 0.5 mL 12/20/2018 Intramuscular   Manufacturer: Moderna   Lot: [id number]   [id number]

## 2019-04-15 ENCOUNTER — Ambulatory Visit: Payer: BC Managed Care – PPO | Attending: Internal Medicine

## 2019-04-15 DIAGNOSIS — Z23 Encounter for immunization: Secondary | ICD-10-CM

## 2019-04-15 NOTE — Progress Notes (Signed)
   Covid-19 Vaccination Clinic  Name:  Hannah Rodgers    MRN: [id number] DOB: [birth year]  04/15/2019  Hannah Rodgers was observed post Covid-19 immunization for 15 minutes without incident. She was provided with Vaccine Information Sheet and instruction to access the V-Safe system.   Hannah Rodgers was instructed to call 911 with any severe reactions post vaccine: Marland Kitchen Difficulty breathing  . Swelling of face and throat  . A fast heartbeat  . A bad rash all over body  . Dizziness and weakness   Immunizations Administered    Name Date Dose VIS Date Route   Moderna COVID-19 Vaccine 04/15/2019  9:45 AM 0.5 mL 12/20/2018 Intramuscular   Manufacturer: Moderna   Lot: [id number]   [id number]

## 2019-04-16 ENCOUNTER — Ambulatory Visit: Payer: BC Managed Care – PPO

## 2019-04-18 ENCOUNTER — Ambulatory Visit: Payer: BC Managed Care – PPO

## 2019-06-04 ENCOUNTER — Encounter: Payer: Self-pay | Admitting: Emergency Medicine

## 2019-06-04 ENCOUNTER — Other Ambulatory Visit: Payer: Self-pay

## 2019-06-04 ENCOUNTER — Ambulatory Visit: Payer: BC Managed Care – PPO

## 2019-06-04 ENCOUNTER — Ambulatory Visit
Admission: EM | Admit: 2019-06-04 | Discharge: 2019-06-04 | Disposition: A | Discharge status: Home / Self Care | Payer: BC Managed Care – PPO | Attending: Family Medicine | Admitting: Family Medicine

## 2019-06-04 DIAGNOSIS — J0100 Acute maxillary sinusitis, unspecified: Secondary | ICD-10-CM | POA: Diagnosis not present

## 2019-06-04 DIAGNOSIS — R062 Wheezing: Secondary | ICD-10-CM | POA: Diagnosis not present

## 2019-06-04 DIAGNOSIS — R05 Cough: Secondary | ICD-10-CM | POA: Diagnosis not present

## 2019-06-04 DIAGNOSIS — R059 Cough, unspecified: Secondary | ICD-10-CM

## 2019-06-04 MED ORDER — AMOXICILLIN 875 MG PO TABS: 875.0000 mg | ORAL_TABLET | Freq: Two times a day (BID) | ORAL | 0 refills | Status: DC

## 2019-06-04 MED ORDER — PREDNISONE 10 MG PO TABS: ORAL_TABLET | ORAL | 0 refills | Status: DC

## 2019-06-04 MED ORDER — ALBUTEROL SULFATE HFA 108 (90 BASE) MCG/ACT IN AERS: 1.0000 | INHALATION_SPRAY | Freq: Four times a day (QID) | RESPIRATORY_TRACT | 0 refills | Status: DC | PRN

## 2019-06-04 NOTE — ED Provider Notes (Signed)
[facility]    CSN: [id number] Arrival date & time: 06/04/19  0915      History   Chief Complaint Chief Complaint  Patient presents with  . Cough    HPI Hannah Rodgers is a 49 y.o. female.   49 yo female with a c/o cough and chest congestion for the past 2 weeks. States she was seen and given prednisone about 10 days ago. States this helped some but symptoms now returning. Also having sinus pressure, sinus congestion and sinus headaches as well as wheezing at night. Denies any fevers.    Cough   Past Medical History:  Diagnosis Date  . Anxiety   . Depression   . Skin cancer 2020   nose, arms    There are no problems to display for this patient.   Past Surgical History:  Procedure Laterality Date  . CESAREAN SECTION     x2  . CYSTOSCOPY N/A 07/04/2018   Procedure: CYSTOSCOPY;  Surgeon: Benjaman Kindler, MD;  Location: [facility] ORS;  Service: Gynecology;  Laterality: N/A;  . LAPAROSCOPIC BILATERAL SALPINGECTOMY Bilateral 07/04/2018   Procedure: LAPAROSCOPIC BILATERAL SALPINGECTOMY;  Surgeon: Benjaman Kindler, MD;  Location: [facility] OR[facility];  Service: Gynecology;  Laterality: Bilateral;  . LAPAROSCOPIC HYSTERECTOMY N/A 07/04/2018   Procedure: HYSTERECTOMY TOTAL LAPAROSCOPIC;  Surgeon: Benjaman Kindler, MD;  Location: [facility] OR[facility];  Service: Gynecology;  Laterality: N/A;  . TONSILLECTOMY      OB History   No obstetric history on file.      Home Medications    Prior to Admission medications   Medication Sig Start Date End Date Taking? Authorizing Provider  gabapentin (NEURONTIN) 800 MG tablet Take 1 tablet (800 mg total) by mouth at bedtime for 14 days. Take nightly for 3 days, then up to 14 days as needed 07/04/18 06/04/19 Yes Benjaman Kindler, MD  montelukast (SINGULAIR) 10 MG tablet Take 10 mg by mouth at bedtime.   Yes [provider]  sertraline (ZOLOFT) 100 MG tablet Take 150 mg by mouth at bedtime.    Yes [provider]  albuterol  (VENTOLIN HFA) 108 (90 Base) MCG/ACT inhaler Inhale 1-2 puffs into the lungs every 6 (six) hours as needed for wheezing or shortness of breath. 06/04/19   Norval Gable, MD  amoxicillin (AMOXIL) 875 MG tablet Take 1 tablet (875 mg total) by mouth 2 (two) times daily. 06/04/19   Norval Gable, MD  docusate sodium (COLACE) 100 MG capsule Take 1 capsule (100 mg total) by mouth 2 (two) times daily. To keep stools soft 07/04/18   Benjaman Kindler, MD  fluticasone [facility]) 50 MCG/ACT nasal spray Place 2 sprays into both nostrils daily.    [provider]  oxycodone (OXY-IR) 5 MG capsule Take 1 capsule (5 mg total) by mouth every 6 (six) hours as needed for pain. 07/04/18   Benjaman Kindler, MD  predniSONE (DELTASONE) 10 MG tablet Start 60 mg po day one, then 50 mg po day two, taper by 10 mg daily until complete. 06/04/19   Norval Gable, MD    Family History Family History  Problem Relation Age of Onset  . Hypertension Mother   . Hypertension Father     Social History Social History   Tobacco Use  . Smoking status: Current Every Day Smoker    Packs/day: 0.50    Types: Cigarettes  . Smokeless tobacco: Never Used  Substance Use Topics  . Alcohol use: No  . Drug use: No     Allergies  Patient has no known allergies.   Review of Systems Review of Systems  Respiratory: Positive for cough.      Physical Exam Triage Vital Signs ED Triage Vitals  Enc Vitals Group     BP 06/04/19 0943 131/75     Pulse Rate 06/04/19 0943 77     Resp 06/04/19 0943 14     Temp 06/04/19 0943 98.2 F (36.8 C)     Temp Source 06/04/19 0943 Oral     SpO2 06/04/19 0943 100 %     Weight 06/04/19 0941 220 lb (99.8 kg)     Height 06/04/19 0941 5\' 6"  (1.676 m)     Head Circumference --      Peak Flow --      Pain Score 06/04/19 0941 6     Pain Loc --      Pain Edu? --      Excl. in [place]? --    No data found.  Updated Vital Signs BP 131/75 (BP Location: Right Arm)   Pulse 77   Temp 98.2 F  (36.8 C) (Oral)   Resp 14   Ht 5\' 6"  (1.676 m)   Wt 99.8 kg   SpO2 100%   BMI 35.51 kg/m   Visual Acuity Right Eye Distance:   Left Eye Distance:   Bilateral Distance:    Right Eye Near:   Left Eye Near:    Bilateral Near:     Physical Exam Vitals and nursing note reviewed.  Constitutional:      General: She is not in acute distress.    Appearance: She is not toxic-appearing or diaphoretic.  HENT:     Right Ear: Tympanic membrane normal.     Left Ear: Tympanic membrane normal.     Nose: Congestion present.     Right Sinus: Maxillary sinus tenderness present.     Left Sinus: Maxillary sinus tenderness present.  Cardiovascular:     Heart sounds: Normal heart sounds.  Pulmonary:     Effort: Pulmonary effort is normal. No respiratory distress.     Breath sounds: Normal breath sounds. No stridor. No wheezing, rhonchi or rales.  Musculoskeletal:     Cervical back: Neck supple.  Neurological:     Mental Status: She is alert.      UC Treatments / Results  Labs (all labs ordered are listed, but only abnormal results are displayed) Labs Reviewed - No data to display  EKG   Radiology DG Chest 2 View  Result Date: 06/04/2019 CLINICAL DATA:  Cough and congestion for 2 weeks. EXAM: CHEST - 2 VIEW COMPARISON:  None. FINDINGS: Cardiomediastinal silhouette is within normal limits in size and configuration. Lungs are clear. Lung volumes are normal. No evidence of pneumonia. No pleural effusion. No pneumothorax seen. Osseous structures about the chest are unremarkable. IMPRESSION: Normal chest x-ray.  No evidence of pneumonia. Electronically Signed   By: Franki Cabot M.D.   On: 06/04/2019 10:18    Procedures Procedures (including critical care time)  Medications Ordered in UC Medications - No data to display  Initial Impression / Assessment and Plan / UC Course  I have reviewed the triage vital signs and the nursing notes.  Pertinent labs & imaging results that were  available during my care of the patient were reviewed by me and considered in my medical decision making (see chart for details).      Final Clinical Impressions(s) / UC Diagnoses   Final diagnoses:  Acute maxillary sinusitis, recurrence  not specified  Cough  Wheezing    ED Prescriptions    Medication Sig Dispense Auth. Provider   amoxicillin (AMOXIL) 875 MG tablet Take 1 tablet (875 mg total) by mouth 2 (two) times daily. 20 tablet Norval Gable, MD   predniSONE (DELTASONE) 10 MG tablet Start 60 mg po day one, then 50 mg po day two, taper by 10 mg daily until complete. 21 tablet Norval Gable, MD   albuterol (VENTOLIN HFA) 108 (90 Base) MCG/ACT inhaler Inhale 1-2 puffs into the lungs every 6 (six) hours as needed for wheezing or shortness of breath. 8 g Norval Gable, MD      1. x-ray results and diagnosis reviewed with patient 2. rx as per orders above; reviewed possible side effects, interactions, risks and benefits  3. Recommend supportive treatment with otc flonase, otc cough medication 4. Follow-up prn if symptoms worsen or don't improve  PDMP not reviewed this encounter.   Norval Gable, MD 06/04/19 1047

## 2019-06-04 NOTE — ED Triage Notes (Signed)
Patient c/o cough and chest congestion for the past 2 weeks and has gotten worse since finishing the Prednisone.  Patient states last dose was on Wed.  Patient denies fevers.

## 2019-07-08 ENCOUNTER — Other Ambulatory Visit: Payer: Self-pay

## 2019-07-08 ENCOUNTER — Ambulatory Visit
Admission: EM | Admit: 2019-07-08 | Discharge: 2019-07-08 | Disposition: A | Discharge status: Home / Self Care | Payer: BC Managed Care – PPO | Attending: Internal Medicine | Admitting: Internal Medicine

## 2019-07-08 ENCOUNTER — Ambulatory Visit (INDEPENDENT_AMBULATORY_CARE_PROVIDER_SITE_OTHER): Payer: BC Managed Care – PPO

## 2019-07-08 DIAGNOSIS — J208 Acute bronchitis due to other specified organisms: Secondary | ICD-10-CM

## 2019-07-08 DIAGNOSIS — R0982 Postnasal drip: Secondary | ICD-10-CM

## 2019-07-08 MED ORDER — PREDNISONE 20 MG PO TABS: 20.0000 mg | ORAL_TABLET | Freq: Every day | ORAL | 0 refills | Status: DC

## 2019-07-08 MED ORDER — GUAIFENESIN 400 MG PO TABS: 400.0000 mg | ORAL_TABLET | Freq: Four times a day (QID) | ORAL | 0 refills | Status: DC | PRN

## 2019-07-08 MED ORDER — BENZONATATE 100 MG PO CAPS: 100.0000 mg | ORAL_CAPSULE | Freq: Three times a day (TID) | ORAL | 0 refills | Status: DC

## 2019-07-08 NOTE — Discharge Instructions (Signed)
Continue with your nasal spray and Singulair Short course of prednisone has been prescribed Tessalon Perles for cough Return to urgent care if symptoms worsen.

## 2019-07-08 NOTE — ED Provider Notes (Signed)
[facility]    CSN: [id number] Arrival date & time: 07/08/19  0946      History   Chief Complaint Chief Complaint  Patient presents with  . Cough    HPI Hannah Rodgers is a 49 y.o. female with a history of allergic rhinitis comes to the urgent care with complaints of cough and shortness of breath of several days duration.  Patient says cough is not productive.  She denies any fever or chills.  She has associated shortness of breath which is worse when she lays down.  She admits to having chest tightness but no chest pain.  Coughing makes chest tightness worse.  Pain is not radiated.  No diaphoresis, dizziness, near syncope or syncopal episode.  No family history of cardiac disease or death at an early age.   HPI  Past Medical History:  Diagnosis Date  . Anxiety   . Depression   . Skin cancer 2020   nose, arms    There are no problems to display for this patient.   Past Surgical History:  Procedure Laterality Date  . CESAREAN SECTION     x2  . CYSTOSCOPY N/A 07/04/2018   Procedure: CYSTOSCOPY;  Surgeon: Benjaman Kindler, MD;  Location: [facility] ORS;  Service: Gynecology;  Laterality: N/A;  . LAPAROSCOPIC BILATERAL SALPINGECTOMY Bilateral 07/04/2018   Procedure: LAPAROSCOPIC BILATERAL SALPINGECTOMY;  Surgeon: Benjaman Kindler, MD;  Location: [facility] OR[facility];  Service: Gynecology;  Laterality: Bilateral;  . LAPAROSCOPIC HYSTERECTOMY N/A 07/04/2018   Procedure: HYSTERECTOMY TOTAL LAPAROSCOPIC;  Surgeon: Benjaman Kindler, MD;  Location: [facility];  Service: Gynecology;  Laterality: N/A;  . TONSILLECTOMY      OB History   No obstetric history on file.      Home Medications    Prior to Admission medications   Medication Sig Start Date End Date Taking? Authorizing Provider  albuterol (VENTOLIN HFA) 108 (90 Base) MCG/ACT inhaler Inhale 1-2 puffs into the lungs every 6 (six) hours as needed for wheezing or shortness of breath. 06/04/19  Yes Norval Gable, MD  buPROPion  HCl [facility] SR PO) Take by mouth daily.   Yes [provider]  fluticasone (FLONASE) 50 MCG/ACT nasal spray Place 2 sprays into both nostrils daily.   Yes [provider]  montelukast (SINGULAIR) 10 MG tablet Take 10 mg by mouth at bedtime.   Yes [provider]  sertraline (ZOLOFT) 100 MG tablet Take 150 mg by mouth at bedtime.    Yes [provider]  gabapentin (NEURONTIN) 800 MG tablet Take 1 tablet (800 mg total) by mouth at bedtime for 14 days. Take nightly for 3 days, then up to 14 days as needed 07/04/18 07/08/19  Benjaman Kindler, MD    Family History Family History  Problem Relation Age of Onset  . Hypertension Mother   . Hypertension Father     Social History Social History   Tobacco Use  . Smoking status: Current Every Day Smoker    Packs/day: 0.50    Types: Cigarettes  . Smokeless tobacco: Never Used  Vaping Use  . Vaping Use: Never used  Substance Use Topics  . Alcohol use: No  . Drug use: No     Allergies   Patient has no known allergies.   Review of Systems Review of Systems  Constitutional: Negative for chills, fatigue and fever.  HENT: Positive for postnasal drip. Negative for ear discharge, ear pain, facial swelling and sore throat.   Eyes: Negative.   Respiratory: Positive for  cough and chest tightness. Negative for wheezing.   Gastrointestinal: Negative for abdominal pain, nausea and vomiting.  Genitourinary: Negative.   Musculoskeletal: Negative for arthralgias, gait problem, joint swelling and myalgias.  Skin: Negative for rash and wound.  Neurological: Negative.      Physical Exam Triage Vital Signs ED Triage Vitals  Enc Vitals Group     BP 07/08/19 1010 136/88     Pulse Rate 07/08/19 1010 64     Resp 07/08/19 1010 18     Temp 07/08/19 1010 97.8 F (36.6 C)     Temp Source 07/08/19 1010 Oral     SpO2 07/08/19 1010 98 %     Weight --      Height --      Head Circumference --      Peak Flow --       Pain Score 07/08/19 1006 0     Pain Loc --      Pain Edu? --      Excl. in [place]? --    No data found.  Updated Vital Signs BP 136/88 (BP Location: Left Arm)   Pulse 64   Temp 97.8 F (36.6 C) (Oral)   Resp 18   SpO2 98%   Visual Acuity Right Eye Distance:   Left Eye Distance:   Bilateral Distance:    Right Eye Near:   Left Eye Near:    Bilateral Near:     Physical Exam Vitals and nursing note reviewed.  Constitutional:      General: She is not in acute distress.    Appearance: Normal appearance. She is not ill-appearing.  HENT:     Right Ear: Tympanic membrane normal.     Left Ear: Tympanic membrane normal.  Eyes:     Conjunctiva/sclera: Conjunctivae normal.  Cardiovascular:     Rate and Rhythm: Normal rate and regular rhythm.     Pulses: Normal pulses.     Heart sounds: Normal heart sounds. No murmur heard.  No friction rub.  Pulmonary:     Effort: Pulmonary effort is normal. No respiratory distress.     Breath sounds: Normal breath sounds.  Abdominal:     General: Bowel sounds are normal.  Musculoskeletal:        General: Normal range of motion.     Cervical back: Normal range of motion and neck supple. No rigidity or tenderness.  Skin:    General: Skin is warm.  Neurological:     Mental Status: She is alert.      UC Treatments / Results  Labs (all labs ordered are listed, but only abnormal results are displayed) Labs Reviewed - No data to display  EKG   Radiology No results found.  Procedures Procedures (including critical care time)  Medications Ordered in UC Medications - No data to display  Initial Impression / Assessment and Plan / UC Course  I have reviewed the triage vital signs and the nursing notes.  Pertinent labs & imaging results that were available during my care of the patient were reviewed by me and considered in my medical decision making (see chart for details).     1.  Cough with chest tightness: EKG shows normal  sinus rhythm-no acute ST or T wave changes Chest x-ray is negative for any acute lung infiltrate Short course of prednisone Tessalon Perles Mucinex Continue fluticasone nasal spray and Singulair Return precautions given. Final Clinical Impressions(s) / UC Diagnoses   Final diagnoses:  None   Discharge  Instructions   None    ED Prescriptions    None     PDMP not reviewed this encounter.   Chase Picket, MD 07/08/19 [phone]

## 2019-07-08 NOTE — ED Triage Notes (Signed)
Pt presents with cough that feels like it is in throat.  States this has been occurring since her last visit here in May.  When she lays down for bed, coughing increases as well as heaviness in chest.  Feels hard to breathe.  During day cough/heaviness not as bad but still there.  Denies dizziness, nausea, pain radiating into L arm or jaw.  States she doesn't feel right.

## 2019-07-11 ENCOUNTER — Encounter: Payer: Self-pay | Admitting: *Deleted

## 2019-07-11 ENCOUNTER — Other Ambulatory Visit: Payer: Self-pay

## 2019-07-11 ENCOUNTER — Emergency Department
Admission: EM | Admit: 2019-07-11 | Discharge: 2019-07-11 | Disposition: A | Discharge status: Home / Self Care | Payer: BC Managed Care – PPO | Attending: Emergency Medicine | Admitting: Emergency Medicine

## 2019-07-11 ENCOUNTER — Emergency Department: Payer: BC Managed Care – PPO

## 2019-07-11 DIAGNOSIS — Z20822 Contact with and (suspected) exposure to covid-19: Secondary | ICD-10-CM | POA: Diagnosis not present

## 2019-07-11 DIAGNOSIS — R0602 Shortness of breath: Secondary | ICD-10-CM | POA: Insufficient documentation

## 2019-07-11 DIAGNOSIS — R079 Chest pain, unspecified: Secondary | ICD-10-CM | POA: Diagnosis present

## 2019-07-11 DIAGNOSIS — R42 Dizziness and giddiness: Secondary | ICD-10-CM

## 2019-07-11 DIAGNOSIS — R5383 Other fatigue: Secondary | ICD-10-CM | POA: Diagnosis not present

## 2019-07-11 DIAGNOSIS — Z85828 Personal history of other malignant neoplasm of skin: Secondary | ICD-10-CM | POA: Diagnosis not present

## 2019-07-11 DIAGNOSIS — F17210 Nicotine dependence, cigarettes, uncomplicated: Secondary | ICD-10-CM | POA: Diagnosis not present

## 2019-07-11 DIAGNOSIS — R0789 Other chest pain: Secondary | ICD-10-CM

## 2019-07-11 LAB — CBC
HCT: 44.5 % (ref 36.0–46.0)
Hemoglobin: 15.1 g/dL — ABNORMAL HIGH (ref 12.0–15.0)
MCH: 30.3 pg (ref 26.0–34.0)
MCHC: 33.9 g/dL (ref 30.0–36.0)
MCV: 89.2 fL (ref 80.0–100.0)
Platelets: 283 10*3/uL (ref 150–400)
RBC: 4.99 MIL/uL (ref 3.87–5.11)
RDW: 12.2 % (ref 11.5–15.5)
WBC: 14.4 10*3/uL — ABNORMAL HIGH (ref 4.0–10.5)
nRBC: 0 % (ref 0.0–0.2)

## 2019-07-11 LAB — URINALYSIS, COMPLETE (UACMP) WITH MICROSCOPIC
Bacteria, UA: NONE SEEN
Bilirubin Urine: NEGATIVE
Glucose, UA: NEGATIVE mg/dL
Hgb urine dipstick: NEGATIVE
Ketones, ur: NEGATIVE mg/dL
Leukocytes,Ua: NEGATIVE
Nitrite: NEGATIVE
Protein, ur: NEGATIVE mg/dL
Specific Gravity, Urine: 1.021 (ref 1.005–1.030)
pH: 5 (ref 5.0–8.0)

## 2019-07-11 LAB — TROPONIN I (HIGH SENSITIVITY)
Troponin I (High Sensitivity): 2 ng/L (ref <18)
Troponin I (High Sensitivity): 3 ng/L (ref <18)

## 2019-07-11 LAB — BASIC METABOLIC PANEL
Anion gap: 9 (ref 5–15)
BUN: 18 mg/dL (ref 6–20)
CO2: 21 mmol/L — ABNORMAL LOW (ref 22–32)
Calcium: 9.6 mg/dL (ref 8.9–10.3)
Chloride: 108 mmol/L (ref 98–111)
Creatinine, Ser: 0.84 mg/dL (ref 0.44–1.00)
GFR calc Af Amer: >60 mL/min (ref >60)
GFR calc non Af Amer: >60 mL/min (ref >60)
Glucose, Bld: 136 mg/dL — ABNORMAL HIGH (ref 70–99)
Potassium: 4.3 mmol/L (ref 3.5–5.1)
Sodium: 138 mmol/L (ref 135–145)

## 2019-07-11 LAB — SARS CORONAVIRUS 2 BY RT PCR (HOSPITAL ORDER, PERFORMED IN ~~LOC~~ HOSPITAL LAB): SARS Coronavirus 2: NEGATIVE

## 2019-07-11 LAB — BRAIN NATRIURETIC PEPTIDE: B Natriuretic Peptide: 53.9 pg/mL (ref 0.0–100.0)

## 2019-07-11 LAB — T4, FREE: Free T4: 0.66 ng/dL (ref 0.61–1.12)

## 2019-07-11 LAB — TSH: TSH: 1.168 u[IU]/mL (ref 0.350–4.500)

## 2019-07-11 MED ORDER — IOHEXOL 350 MG/ML SOLN
100.0000 mL | Freq: Once | INTRAVENOUS | Status: AC | PRN
  Administered 2019-07-11: 100 mL via INTRAVENOUS

## 2019-07-11 MED ORDER — KETOROLAC TROMETHAMINE 30 MG/ML IJ SOLN
10.0000 mg | Freq: Once | INTRAMUSCULAR | Status: AC
  Administered 2019-07-11: 9.9 mg via INTRAVENOUS
  Filled 2019-07-11: qty 1

## 2019-07-11 MED ORDER — PREDNISONE 20 MG PO TABS: 40.0000 mg | ORAL_TABLET | Freq: Every day | ORAL | 0 refills | Status: AC

## 2019-07-11 MED ORDER — METHYLPREDNISOLONE SODIUM SUCC 125 MG IJ SOLR
80.0000 mg | Freq: Once | INTRAMUSCULAR | Status: AC
  Administered 2019-07-11: 80 mg via INTRAVENOUS
  Filled 2019-07-11: qty 2

## 2019-07-11 MED ORDER — SODIUM CHLORIDE 0.9 % IV BOLUS
1000.0000 mL | Freq: Once | INTRAVENOUS | Status: AC
  Administered 2019-07-11: 1000 mL via INTRAVENOUS

## 2019-07-11 MED ORDER — IPRATROPIUM-ALBUTEROL 0.5-2.5 (3) MG/3ML IN SOLN
3.0000 mL | Freq: Once | RESPIRATORY_TRACT | Status: AC
  Administered 2019-07-11: 3 mL via RESPIRATORY_TRACT
  Filled 2019-07-11: qty 3

## 2019-07-11 NOTE — ED Provider Notes (Signed)
8:01 AM Assumed care for off going team.   Blood pressure (!) 157/77, pulse 68, temperature (!) 97.4 F (36.3 C), temperature source Oral, resp. rate 14, height 5\' 6"  (1.676 m), weight 99.8 kg, SpO2 100 %.  See their HPI for full report but in brief  Trop, CT, covid,--> seen at [facility] on the 19th for bronchitis. Worsening symptoms, dizzy fatigue weak--> better with fluids, neb. Plan to dc with higher dose of steroids if workup is negative.    Repeat cardiac markers negative  CT of the chest no evidence of PE  8:32 AM reevaluated patient.  Patient states that she is feeling much better.  Patient sats are 100% with clear lungs bilaterally.  Patient feels comfortable to be discharged home on a higher dose of steroids per Dr. Beather Arbour.  Already has inhaler at home.    Vanessa [place], MD 07/11/19 [phone]

## 2019-07-11 NOTE — ED Notes (Signed)
Blue top sent if needed 

## 2019-07-11 NOTE — ED Notes (Signed)
EDP at bedside  

## 2019-07-11 NOTE — ED Provider Notes (Signed)
[facility] Emergency Department Provider Note   ____________________________________________   First MD Initiated Contact with Patient 07/11/19 0507     (approximate)  I have reviewed the triage vital signs and the nursing notes.   HISTORY  Chief Complaint Chest Pain and Dizziness    HPI Hannah Rodgers is a 49 y.o. female who presents to the ED from home with a chief complaint of chest tightness, dizziness and fatigue x1 week.  Patient [place] the beach before the weekend with nonproductive cough, congestion and the above symptoms.  She was seen at urgent care on 07/08/2019 and placed on prednisone, Tessalon Perles, Mucinex for bronchitis.  States she has not improved.  Feels chest tightness mainly on supine position.  Denies fever, chills, abdominal pain, nausea, vomiting or diarrhea.  Denies recent trauma or hormone use.  Patient has received both doses of her COVID-19 vaccination.       Past Medical History:  Diagnosis Date  . Anxiety   . Depression   . Skin cancer 2020   nose, arms    There are no problems to display for this patient.   Past Surgical History:  Procedure Laterality Date  . CESAREAN SECTION     x2  . CYSTOSCOPY N/A 07/04/2018   Procedure: CYSTOSCOPY;  Surgeon: Benjaman Kindler, MD;  Location: [facility] OR[facility];  Service: Gynecology;  Laterality: N/A;  . LAPAROSCOPIC BILATERAL SALPINGECTOMY Bilateral 07/04/2018   Procedure: LAPAROSCOPIC BILATERAL SALPINGECTOMY;  Surgeon: Benjaman Kindler, MD;  Location: [facility] ORS;  Service: Gynecology;  Laterality: Bilateral;  . LAPAROSCOPIC HYSTERECTOMY N/A 07/04/2018   Procedure: HYSTERECTOMY TOTAL LAPAROSCOPIC;  Surgeon: Benjaman Kindler, MD;  Location: [facility] ORS;  Service: Gynecology;  Laterality: N/A;  . TONSILLECTOMY      Prior to Admission medications   Medication Sig Start Date End Date Taking? Authorizing Provider  albuterol (VENTOLIN HFA) 108 (90 Base) MCG/ACT inhaler Inhale 1-2 puffs  into the lungs every 6 (six) hours as needed for wheezing or shortness of breath. 06/04/19   Norval Gable, MD  benzonatate (TESSALON) 100 MG capsule Take 1 capsule (100 mg total) by mouth every 8 (eight) hours. 07/08/19   Chase Picket, MD  buPROPion HCl [facility] SR PO) Take by mouth daily.    [provider]  fluticasone (FLONASE) 50 MCG/ACT nasal spray Place 2 sprays into both nostrils daily.    [provider]  guaifenesin (HUMIBID E) 400 MG TABS tablet Take 1 tablet (400 mg total) by mouth every 6 (six) hours as needed. 07/08/19   Lamptey, Myrene Galas, MD  montelukast (SINGULAIR) 10 MG tablet Take 10 mg by mouth at bedtime.    [provider]  predniSONE (DELTASONE) 20 MG tablet Take 1 tablet (20 mg total) by mouth daily for 5 days. 07/08/19 07/13/19  Chase Picket, MD  sertraline (ZOLOFT) 100 MG tablet Take 150 mg by mouth at bedtime.     [provider]  gabapentin (NEURONTIN) 800 MG tablet Take 1 tablet (800 mg total) by mouth at bedtime for 14 days. Take nightly for 3 days, then up to 14 days as needed 07/04/18 07/08/19  Benjaman Kindler, MD    Allergies Patient has no known allergies.  Family History  Problem Relation Age of Onset  . Hypertension Mother   . Hypertension Father     Social History Social History   Tobacco Use  . Smoking status: Current Every Day Smoker    Packs/day: 0.50    Types: Cigarettes  .  Smokeless tobacco: Never Used  Vaping Use  . Vaping Use: Never used  Substance Use Topics  . Alcohol use: No  . Drug use: No    Review of Systems  Constitutional: Positive for body aches and fatigue.  No fever/chills Eyes: No visual changes. ENT: No sore throat. Cardiovascular: Positive for chest tightness.Marland Kitchen Respiratory: Positive for cough.  Denies shortness of breath. Gastrointestinal: No abdominal pain.  No nausea, no vomiting.  No diarrhea.  No constipation. Genitourinary: Negative for dysuria. Musculoskeletal:  Negative for back pain. Skin: Negative for rash. Neurological: Positive for dizziness.  Negative for headaches, focal weakness or numbness.   ____________________________________________   PHYSICAL EXAM:  VITAL SIGNS: ED Triage Vitals [07/11/19 0244]  Enc Vitals Group     BP (!) 182/92     Pulse Rate 62     Resp 16     Temp (!) 97.5 F (36.4 C)     Temp Source Oral     SpO2 98 %     Weight 220 lb (99.8 kg)     Height 5\' 6"  (1.676 m)     Head Circumference      Peak Flow      Pain Score 6     Pain Loc      Pain Edu?      Excl. in [place]?     Constitutional: Alert and oriented. Well appearing and in mild acute distress. Eyes: Conjunctivae are normal. PERRL. EOMI. Head: Atraumatic. Nose: Congestion/rhinnorhea. Mouth/Throat: Mucous membranes are mildly dry. Neck: No stridor.   Cardiovascular: Normal rate, regular rhythm. Grossly normal heart sounds.  Good peripheral circulation. Respiratory: Normal respiratory effort.  No retractions. Lungs slightly diminished bibasilarly.  Loose rattly cough noted. Gastrointestinal: Soft and nontender to light or deep palpation. No distention. No abdominal bruits. No CVA tenderness. Musculoskeletal: No lower extremity tenderness nor edema.  No calf tenderness.  No joint effusions. Neurologic:  Normal speech and language. No gross focal neurologic deficits are appreciated. No gait instability. Skin:  Skin is warm, dry and intact. No rash noted.  No petechiae. Psychiatric: Mood and affect are normal. Speech and behavior are normal.  ____________________________________________   LABS (all labs ordered are listed, but only abnormal results are displayed)  Labs Reviewed  BASIC METABOLIC PANEL - Abnormal; Notable for the following components:      Result Value   CO2 21 (*)    Glucose, Bld 136 (*)    All other components within normal limits  CBC - Abnormal; Notable for the following components:   WBC 14.4 (*)    Hemoglobin 15.1 (*)     All other components within normal limits  URINALYSIS, COMPLETE (UACMP) WITH MICROSCOPIC - Abnormal; Notable for the following components:   Color, Urine YELLOW (*)    APPearance CLEAR (*)    All other components within normal limits  SARS CORONAVIRUS 2 BY RT PCR (HOSPITAL ORDER, [place] [facility])  BRAIN NATRIURETIC PEPTIDE  TSH  T4, FREE  POC URINE PREG, ED  TROPONIN I (HIGH SENSITIVITY)  TROPONIN I (HIGH SENSITIVITY)   ____________________________________________  EKG  ED ECG REPORT I, Caelen Reierson J, the attending physician, personally viewed and interpreted this ECG.   Date: 07/11/2019  EKG Time: 0246  Rate: 61  Rhythm: normal EKG, normal sinus rhythm  Axis: Normal  Intervals: RAD  ST&T Change: Nonspecific  ____________________________________________  RADIOLOGY  ED MD interpretation: No acute cardiopulmonary process  Official radiology report(s): DG Chest 2 View  Result  Date: 07/11/2019 CLINICAL DATA:  Chest pain EXAM: CHEST - 2 VIEW COMPARISON:  07/08/2019 FINDINGS: The heart size and mediastinal contours are within normal limits. Both lungs are clear. The visualized skeletal structures are unremarkable. IMPRESSION: No active cardiopulmonary disease. Electronically Signed   By: Ulyses Jarred M.D.   On: 07/11/2019 03:16    ____________________________________________   PROCEDURES  Procedure(s) performed (including Critical Care):  .1-3 Lead EKG Interpretation Performed by: Paulette Blanch, MD Authorized by: Paulette Blanch, MD     Interpretation: normal     ECG rate:  60   ECG rate assessment: normal     Rhythm: sinus rhythm     Ectopy: none     Conduction: normal   Comments:     Patient placed on cardiac monitor to evaluate for arrhythmias     ____________________________________________   INITIAL IMPRESSION / ASSESSMENT AND PLAN / ED COURSE  As part of my medical decision making, I reviewed the following data within the [place] notes reviewed and incorporated, Labs reviewed, EKG interpreted, Old chart reviewed, Radiograph reviewed and Notes from prior ED visits     GWENDELYN LANTING was evaluated in Emergency Department on 07/11/2019 for the symptoms described in the history of present illness. She was evaluated in the context of the global COVID-19 pandemic, which necessitated consideration that the patient might be at risk for infection with the SARS-CoV-2 virus that causes COVID-19. Institutional protocols and algorithms that pertain to the evaluation of patients at risk for COVID-19 are in a state of rapid change based on information released by regulatory bodies including the CDC and federal and state organizations. These policies and algorithms were followed during the patient's care in the ED.    49 year old female currently on prednisone, Mucinex and albuterol inhaler for bronchitis presenting with chest tightness, dizziness and fatigue. Differential diagnosis includes, but is not limited to, ACS, aortic dissection, pulmonary embolism, cardiac tamponade, pneumothorax, pneumonia, pericarditis, myocarditis, GI-related causes including esophagitis/gastritis, and musculoskeletal chest wall pain.    Initial EKG and troponin unremarkable.  Moderate leukocytosis noted.  Will check thyroid panel, BNP, repeat troponin.  Check Covid swab.  Obtain CTA chest to evaluate for PE.  Initiate IV fluid hydration, IV Toradol for body aches and reassess.  Clinical Course as of Jul 11 651  Tue Jul 11, 2019  0652 Patient feeling better after Toradol and DuoNeb.  Awaiting results of repeat troponin, Covid and CT chest.  Patient tells me she has been on 20 mg of prednisone for the past 3 days.  This is likely subtherapeutic; will administer IV Solu-Medrol.  Care transferred to Dr. Jari Pigg at change of shift   [JS]    Clinical Course User Index [JS] Paulette Blanch, MD      ____________________________________________   FINAL CLINICAL IMPRESSION(S) / ED DIAGNOSES  Final diagnoses:  Dizziness  Fatigue, unspecified type  Chest tightness     ED Discharge Orders    None       Note:  This document was prepared using Dragon voice recognition software and may include unintentional dictation errors.   Paulette Blanch, MD 07/11/19 [phone]

## 2019-07-11 NOTE — Discharge Instructions (Addendum)
CT scan as below.  Take the higher dose of steroids and her inhaler to help with your symptoms.  Your Covid test is still pending and can be followed up on MyChart.  Return to the ER for worsening shortness of breath or any other concerns   IMPRESSION: 1. No pulmonary embolus. 2. Mild cardiomegaly without failure. 3. Hepatic steatosis and hepatomegaly.

## 2019-07-11 NOTE — ED Notes (Signed)
Pt alert and oriented X 4, stable for discharge. RR even and unlabored, color WNL. Discussed discharge instructions and follow up when appropriate. Instructed to follow up with ER for any life threatening symptoms or concerns that patient or family of patient may have  

## 2019-07-11 NOTE — ED Triage Notes (Signed)
Pt to ED reporting worsening chest tightness, dizziness and fatigue over the past week. Pt dx with bronchitis a week ago and placed on prednisone but has not felt any improvement. No fevers and pt denies a cough at this time.

## 2019-07-26 ENCOUNTER — Other Ambulatory Visit: Payer: Self-pay | Admitting: Student

## 2019-07-26 DIAGNOSIS — Z1231 Encounter for screening mammogram for malignant neoplasm of breast: Secondary | ICD-10-CM

## 2019-08-01 ENCOUNTER — Other Ambulatory Visit: Payer: Self-pay

## 2019-08-01 ENCOUNTER — Ambulatory Visit
Admission: RE | Admit: 2019-08-01 | Discharge: 2019-08-01 | Disposition: A | Discharge status: Home / Self Care | Payer: BC Managed Care – PPO | Source: Ambulatory Visit | Attending: Student | Admitting: Student

## 2019-08-01 DIAGNOSIS — Z1231 Encounter for screening mammogram for malignant neoplasm of breast: Secondary | ICD-10-CM | POA: Diagnosis not present

## 2019-10-08 ENCOUNTER — Other Ambulatory Visit: Payer: Self-pay

## 2019-10-08 ENCOUNTER — Ambulatory Visit
Admission: EM | Admit: 2019-10-08 | Discharge: 2019-10-08 | Disposition: A | Discharge status: Home / Self Care | Payer: BC Managed Care – PPO | Attending: Emergency Medicine | Admitting: Emergency Medicine

## 2019-10-08 ENCOUNTER — Encounter: Payer: Self-pay | Admitting: Emergency Medicine

## 2019-10-08 ENCOUNTER — Ambulatory Visit (INDEPENDENT_AMBULATORY_CARE_PROVIDER_SITE_OTHER): Payer: BC Managed Care – PPO

## 2019-10-08 DIAGNOSIS — F17210 Nicotine dependence, cigarettes, uncomplicated: Secondary | ICD-10-CM | POA: Insufficient documentation

## 2019-10-08 DIAGNOSIS — F329 Major depressive disorder, single episode, unspecified: Secondary | ICD-10-CM | POA: Insufficient documentation

## 2019-10-08 DIAGNOSIS — Z85828 Personal history of other malignant neoplasm of skin: Secondary | ICD-10-CM | POA: Diagnosis not present

## 2019-10-08 DIAGNOSIS — F419 Anxiety disorder, unspecified: Secondary | ICD-10-CM | POA: Insufficient documentation

## 2019-10-08 DIAGNOSIS — R05 Cough: Secondary | ICD-10-CM

## 2019-10-08 DIAGNOSIS — J019 Acute sinusitis, unspecified: Secondary | ICD-10-CM | POA: Diagnosis present

## 2019-10-08 DIAGNOSIS — Z20822 Contact with and (suspected) exposure to covid-19: Secondary | ICD-10-CM | POA: Insufficient documentation

## 2019-10-08 DIAGNOSIS — Z79899 Other long term (current) drug therapy: Secondary | ICD-10-CM | POA: Insufficient documentation

## 2019-10-08 LAB — GROUP A STREP BY PCR: Group A Strep by PCR: NOT DETECTED

## 2019-10-08 MED ORDER — AMOXICILLIN-POT CLAVULANATE 875-125 MG PO TABS
1.0000 | ORAL_TABLET | Freq: Two times a day (BID) | ORAL | 0 refills | Status: DC
Start: 2019-10-08 — End: 2020-10-12

## 2019-10-08 MED ORDER — PREDNISONE 10 MG (21) PO TBPK
ORAL_TABLET | ORAL | 0 refills | Status: DC
Start: 2019-10-08 — End: 2020-10-12

## 2019-10-08 NOTE — Discharge Instructions (Signed)
You were seen for nasal congestion and cough and are being treated for bacterial sinusitis.   -Take the antibiotics as prescribed until they're finished. If you think you're having a reaction, stop the medication, take benadryl and go to the nearest urgent care/emergency room. Take a probiotic while taking the antibiotic to decrease the chances of stomach upset.  -Continue using her allergy medication and nasal spray. -Make sure you monitor your symptoms, that you do get better in about 3 to 5 days.  Take care, Dr. Marland Kitchen, NP-c

## 2019-10-08 NOTE — ED Provider Notes (Signed)
[facility] - [place], [place]   Name: Hannah Rodgers DOB: [birth year] MRN: [id number] CSN: [id number] PCP: [place] Pink, MD  Arrival date and time:  10/08/19 1024  Chief Complaint:  Sore Throat, Cough, and head congestion   NOTE: Prior to seeing the patient today, I have reviewed the triage nursing documentation and vital signs. Clinical staff has updated patient's PMH/PSHx, current medication list, and drug allergies/intolerances to ensure comprehensive history available to assist in medical decision making.   History:   HPI: Hannah Rodgers is a 49 y.o. female who presents today with complaints below.  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  Patient with history of bacterial sinus infections.  No recent antibiotic use.  Past Medical History:  Diagnosis Date   Anxiety    Depression    Skin cancer 2020   nose, arms    Past Surgical History:  Procedure Laterality Date   ABDOMINAL HYSTERECTOMY     CESAREAN SECTION     x2   CYSTOSCOPY N/A 07/04/2018   Procedure: CYSTOSCOPY;  Surgeon: Benjaman Kindler, MD;  Location: [facility];  Service: Gynecology;  Laterality: N/A;   LAPAROSCOPIC BILATERAL SALPINGECTOMY Bilateral 07/04/2018   Procedure: LAPAROSCOPIC BILATERAL SALPINGECTOMY;  Surgeon: Benjaman Kindler, MD;  Location: [facility];  Service: Gynecology;  Laterality: Bilateral;   LAPAROSCOPIC HYSTERECTOMY N/A 07/04/2018   Procedure: HYSTERECTOMY TOTAL LAPAROSCOPIC;  Surgeon: Benjaman Kindler, MD;  Location: [facility];  Service: Gynecology;  Laterality: N/A;   TONSILLECTOMY       Family History  Problem Relation Age of Onset   Hypertension Mother    Hypertension Father    Stroke Father    Breast cancer Neg Hx     Social History   Tobacco Use   Smoking status: Current Every Day Smoker    Packs/day: 0.25    Types: Cigarettes   Smokeless tobacco: Never Used  Vaping Use   Vaping Use: Never used  Substance Use Topics   Alcohol use: No   Drug use: No    There are no problems to display for this patient.   Home Medications:    Current Meds  Medication Sig   acetaminophen (TYLENOL) 500 MG tablet TAKE 2 TABLETS (1,000 MG TOTAL) BY MOUTH EVERY 6 (SIX) HOURS FOR 3 DAYS.   albuterol (VENTOLIN HFA) 108 (90 Base) MCG/ACT inhaler Inhale 1-2 puffs into the lungs every 6 (six) hours as needed for wheezing or shortness of breath.   buPROPion (WELLBUTRIN XL) 150 MG 24 hr tablet 1 Tablet(s) By Mouth Every Morning   fluticasone (FLONASE) 50 MCG/ACT nasal spray Place 2 sprays into both nostrils daily.   montelukast (SINGULAIR) 10 MG tablet Take 10 mg by mouth at bedtime.   Nutritional Supplements (ESTROVEN PO) Take 1 tablet by mouth daily.   Probiotic Product (PROBIOTIC PO) Take 1 tablet by mouth daily.   sertraline (ZOLOFT) 100 MG tablet Take 150 mg by mouth at bedtime.     Allergies:   Patient has no known allergies.  Review of Systems (ROS): Review of Systems  Constitutional: Positive for appetite change and fatigue. Negative for activity change, diaphoresis and fever.  HENT: Positive for congestion, postnasal drip,  sinus pressure and sore throat. Negative for ear discharge, ear pain, facial swelling, hearing loss, sinus pain and sneezing.   Eyes: Negative for pain.  Respiratory: Positive for cough. Negative for shortness of breath and wheezing.   Gastrointestinal: Negative for constipation, nausea and vomiting.  Musculoskeletal: Positive for myalgias.  All other systems reviewed and are negative.    Vital Signs: Today's Vitals    10/08/19 1046 10/08/19 1047 10/08/19 1050  BP:   118/74  Pulse:   70  Resp:   18  Temp:   98.2 F (36.8 C)  TempSrc:   Oral  SpO2:   99%  Weight:  220 lb (99.8 kg)   Height:  5\' 6"  (1.676 m)   PainSc: 0-No pain      Physical Exam: Physical Exam Vitals and nursing note reviewed.  Constitutional:      Appearance: She is well-developed.  HENT:     Right Ear: Tympanic membrane normal.     Left Ear: Tympanic membrane normal.     Nose: Congestion present.     Mouth/Throat:     Mouth: Mucous membranes are moist.     Pharynx: Posterior oropharyngeal erythema present. No oropharyngeal exudate.     Tonsils: No tonsillar exudate.  Cardiovascular:     Rate and Rhythm: Normal rate and regular rhythm.     Pulses: Normal pulses.     Heart sounds: Normal heart sounds.  Pulmonary:     Effort: Pulmonary effort is normal.     Breath sounds: Examination of the right-lower field reveals decreased breath sounds. Decreased breath sounds present.  Neurological:     Mental Status: She is alert.      Urgent Care Treatments / Results:   LABS: PLEASE NOTE: all labs that were ordered this encounter are listed, however only abnormal results are displayed. Labs Reviewed  GROUP A STREP BY PCR  SARS CORONAVIRUS 2 (TAT 6-24 HRS)    EKG: -None  RADIOLOGY: DG Chest 2 View  Result Date: 10/08/2019 CLINICAL DATA:  Cough for 9 days EXAM: CHEST - 2 VIEW COMPARISON:  07/11/2019 chest radiograph. FINDINGS: Stable cardiomediastinal silhouette with normal heart size. No pneumothorax. No pleural effusion. Lungs appear clear, with no acute consolidative airspace disease and no pulmonary edema. IMPRESSION: No active cardiopulmonary disease. Electronically Signed   By: Ilona Sorrel M.D.   On: 10/08/2019 11:34    PROCEDURES: Procedures  MEDICATIONS RECEIVED THIS VISIT: Medications - No data to display  PERTINENT CLINICAL COURSE NOTES/UPDATES:   Initial Impression / Assessment and Plan / Urgent Care  Course:  Pertinent labs & imaging results that were available during my care of the patient were personally reviewed by me and considered in my medical decision making (see lab/imaging section of note for values and interpretations).  Hannah Rodgers is a 49 y.o. female who presents to [facility] today with complaints of nasal congestion and fatigue, diagnosed with bacterial sinusitis, and treated as such with the medications below. NP and patient reviewed discharge instructions below during visit.   Patient is well appearing overall in clinic today. She does not appear to be in any acute distress. Presenting symptoms (see HPI) and exam as documented above.   I have reviewed the follow up and strict return precautions for any new or worsening symptoms. Patient is aware of symptoms that would be deemed urgent/emergent, and would thus require further evaluation either here or in the emergency department. At the time of discharge, she verbalized understanding and  consent with the discharge plan as it was reviewed with her. All questions were fielded by provider and/or clinic staff prior to patient discharge.    Final Clinical Impressions / Urgent Care Diagnoses:   Final diagnoses:  Acute non-recurrent sinusitis, unspecified location    New Prescriptions:  [place] Controlled Substance Registry consulted? Not Applicable  Meds ordered this encounter  Medications   predniSONE (STERAPRED UNI-PAK 21 TAB) 10 MG (21) TBPK tablet    Sig: Take as instructed on package (60, 50, 40, 30, 20, 10)    Dispense:  1 each    Refill:  0   amoxicillin-clavulanate (AUGMENTIN) 875-125 MG tablet    Sig: Take 1 tablet by mouth every 12 (twelve) hours.    Dispense:  14 tablet    Refill:  0      Discharge Instructions     You were seen for nasal congestion and cough and are being treated for bacterial sinusitis.   -Take the antibiotics as prescribed until they're finished. If you think you're having a  reaction, stop the medication, take benadryl and go to the nearest urgent care/emergency room. Take a probiotic while taking the antibiotic to decrease the chances of stomach upset.  -Continue using her allergy medication and nasal spray. -Make sure you monitor your symptoms, that you do get better in about 3 to 5 days.  Take care, Dr. Marland Kitchen, NP-c      Recommended Follow up Care:  Patient encouraged to follow up with the following provider within the specified time frame, or sooner as dictated by the severity of her symptoms. As always, she was instructed that for any urgent/emergent care needs, she should seek care either here or in the emergency department for more immediate evaluation.   Gertie Baron, DNP, NP-c    Gertie Baron, NP 10/08/19 1153

## 2019-10-08 NOTE — ED Triage Notes (Signed)
Patient in today c/o sore throat, cough, head congestion and fatigue x 9 days. Patient has been taking OTC Dayquil/Nyquil without relief. Patient has had the covid vaccines.

## 2019-10-09 LAB — SARS CORONAVIRUS 2 (TAT 6-24 HRS): SARS Coronavirus 2: NEGATIVE

## 2020-08-02 ENCOUNTER — Other Ambulatory Visit: Payer: Self-pay | Admitting: Student

## 2020-08-02 DIAGNOSIS — Z1231 Encounter for screening mammogram for malignant neoplasm of breast: Secondary | ICD-10-CM

## 2020-08-08 ENCOUNTER — Other Ambulatory Visit: Payer: Self-pay

## 2020-08-08 ENCOUNTER — Ambulatory Visit
Admission: RE | Admit: 2020-08-08 | Discharge: 2020-08-08 | Disposition: A | Discharge status: Home / Self Care | Payer: BC Managed Care – PPO | Source: Ambulatory Visit | Attending: Student | Admitting: Student

## 2020-08-08 DIAGNOSIS — Z1231 Encounter for screening mammogram for malignant neoplasm of breast: Secondary | ICD-10-CM | POA: Insufficient documentation

## 2020-10-12 ENCOUNTER — Other Ambulatory Visit: Payer: Self-pay

## 2020-10-12 ENCOUNTER — Ambulatory Visit
Admission: EM | Admit: 2020-10-12 | Discharge: 2020-10-12 | Disposition: A | Discharge status: Home / Self Care | Payer: BC Managed Care – PPO | Attending: Emergency Medicine | Admitting: Emergency Medicine

## 2020-10-12 ENCOUNTER — Encounter: Payer: Self-pay | Admitting: Emergency Medicine

## 2020-10-12 DIAGNOSIS — J0100 Acute maxillary sinusitis, unspecified: Secondary | ICD-10-CM | POA: Diagnosis not present

## 2020-10-12 MED ORDER — AMOXICILLIN-POT CLAVULANATE 875-125 MG PO TABS: 1.0000 | ORAL_TABLET | Freq: Two times a day (BID) | ORAL | 0 refills | Status: AC

## 2020-10-12 MED ORDER — PREDNISONE 50 MG PO TABS: ORAL_TABLET | ORAL | 0 refills | Status: DC

## 2020-10-12 NOTE — Discharge Instructions (Addendum)
The Augmentin twice daily with food for 14 days for treatment of your sinusitis.  If you do not have any improvement of your sinus symptoms after 48 hours fill the prednisone and start taking it.  Perform sinus irrigation 2-3 times a day with a NeilMed sinus rinse kit and distilled water.  Do not use tap water.  You can use plain over-the-counter Mucinex every 6 hours to break up the stickiness of the mucus so your body can clear it.  Increase your oral fluid intake to thin out your mucus so that is also able for your body to clear more easily.  Take an over-the-counter probiotic, such as Culturelle-align-activia, 1 hour after each dose of antibiotic to prevent diarrhea.  If you develop any new or worsening symptoms return for reevaluation or see your primary care provider.

## 2020-10-12 NOTE — ED Provider Notes (Signed)
[facility]    CSN: [id number] Arrival date & time: 10/12/20  1217      History   Chief Complaint Chief Complaint  Patient presents with   Nasal Congestion   Cough    HPI Hannah Rodgers is a 50 y.o. female.   HPI  50 year old female here for evaluation of sinus complaints.  Patient reports that she has been experiencing sinus pressure and a frontal headache for the last week.  Yesterday she developed some chest congestion and a cough.  She reports that this morning she had a cough and sneeze the same time and a large plug of brown discharge came out of her mouth.  She states that she has been blowing her nose but has been unable to get any discharge from her nose.  She denies fever, ear pain, sore throat, shortness breath or wheezing, or GI complaints.  Patient reports that she frequently gets sinus infections at the change of seasons twice annually.  Past Medical History:  Diagnosis Date   Anxiety    Depression    Skin cancer 2020   nose, arms    There are no problems to display for this patient.   Past Surgical History:  Procedure Laterality Date   ABDOMINAL HYSTERECTOMY     CESAREAN SECTION     x2   CYSTOSCOPY N/A 07/04/2018   Procedure: CYSTOSCOPY;  Surgeon: Benjaman Kindler, MD;  Location: [facility];  Service: Gynecology;  Laterality: N/A;   LAPAROSCOPIC BILATERAL SALPINGECTOMY Bilateral 07/04/2018   Procedure: LAPAROSCOPIC BILATERAL SALPINGECTOMY;  Surgeon: Benjaman Kindler, MD;  Location: [facility];  Service: Gynecology;  Laterality: Bilateral;   LAPAROSCOPIC HYSTERECTOMY N/A 07/04/2018   Procedure: HYSTERECTOMY TOTAL LAPAROSCOPIC;  Surgeon: Benjaman Kindler, MD;  Location: [facility];  Service: Gynecology;  Laterality: N/A;   TONSILLECTOMY      OB History   No obstetric history on file.      Home Medications    Prior to Admission medications   Medication Sig Start Date End Date Taking? Authorizing Provider  amoxicillin-clavulanate  (AUGMENTIN) 875-125 MG tablet Take 1 tablet by mouth every 12 (twelve) hours for 14 days. 10/12/20 10/26/20 Yes Margarette Canada, NP  montelukast (SINGULAIR) 10 MG tablet Take 10 mg by mouth at bedtime.   Yes [provider]  predniSONE (DELTASONE) 50 MG tablet Take 1 tablet daily by mouth for 5 days. 10/12/20  Yes Margarette Canada, NP  sertraline (ZOLOFT) 100 MG tablet Take 150 mg by mouth at bedtime.    Yes [provider]  acetaminophen (TYLENOL) 500 MG tablet TAKE 2 TABLETS (1,000 MG TOTAL) BY MOUTH EVERY 6 (SIX) HOURS FOR 3 DAYS. 07/04/18   [provider]  albuterol (VENTOLIN HFA) 108 (90 Base) MCG/ACT inhaler Inhale 1-2 puffs into the lungs every 6 (six) hours as needed for wheezing or shortness of breath. 06/04/19   Norval Gable, MD  fluticasone (FLONASE) 50 MCG/ACT nasal spray Place 2 sprays into both nostrils daily.    [provider]  Nutritional Supplements (ESTROVEN PO) Take 1 tablet by mouth daily.    [provider]  Probiotic Product (PROBIOTIC PO) Take 1 tablet by mouth daily.    [provider]  fexofenadine (ALLEGRA) 180 MG tablet Take by mouth.  10/08/19  [provider]  gabapentin (NEURONTIN) 800 MG tablet Take 1 tablet (800 mg total) by mouth at bedtime for 14 days. Take nightly for 3 days, then up to 14 days as needed 07/04/18 07/08/19  Benjaman Kindler,  MD    Family History Family History  Problem Relation Age of Onset   Hypertension Mother    Hypertension Father    Stroke Father    Breast cancer Neg Hx     Social History Social History   Tobacco Use   Smoking status: Every Day    Packs/day: 0.25    Types: Cigarettes   Smokeless tobacco: Never  Vaping Use   Vaping Use: Never used  Substance Use Topics   Alcohol use: No   Drug use: No     Allergies   Patient has no known allergies.   Review of Systems Review of Systems  Constitutional:  Negative for activity change, appetite change and fever.  HENT:   Positive for congestion, sinus pressure and sinus pain. Negative for ear pain, rhinorrhea and sore throat.   Respiratory:  Positive for cough. Negative for shortness of breath and wheezing.   Gastrointestinal:  Negative for diarrhea, nausea and vomiting.  Skin:  Negative for rash.  Neurological:  Positive for headaches.  Hematological: Negative.   Psychiatric/Behavioral: Negative.      Physical Exam Triage Vital Signs ED Triage Vitals  Enc Vitals Group     BP 10/12/20 1233 (!) 136/93     Pulse Rate 10/12/20 1233 76     Resp 10/12/20 1233 14     Temp 10/12/20 1233 97.9 F (36.6 C)     Temp Source 10/12/20 1233 Oral     SpO2 10/12/20 1233 97 %     Weight 10/12/20 1230 183 lb (83 kg)     Height 10/12/20 1230 _0  (1.676 m)     Head Circumference --      Peak Flow --      Pain Score 10/12/20 1230 7     Pain Loc --      Pain Edu? --      Excl. in [place]? --    No data found.  Updated Vital Signs BP (!) 136/93 (BP Location: Left Arm)   Pulse 76   Temp 97.9 F (36.6 C) (Oral)   Resp 14   Ht _1  (1.676 m)   Wt 183 lb (83 kg)   SpO2 97%   BMI 29.54 kg/m   Visual Acuity Right Eye Distance:   Left Eye Distance:   Bilateral Distance:    Right Eye Near:   Left Eye Near:    Bilateral Near:     Physical Exam Vitals and nursing note reviewed.  Constitutional:      General: She is not in acute distress.    Appearance: Normal appearance. She is normal weight. She is not ill-appearing.  HENT:     Head: Normocephalic and atraumatic.     Right Ear: Tympanic membrane, ear canal and external ear normal. There is no impacted cerumen.     Left Ear: Tympanic membrane, ear canal and external ear normal. There is no impacted cerumen.     Nose: Congestion and rhinorrhea present.     Comments: Marked erythema and edema both nares with purulent discharge clinging to both walls.  Patient has marked tenderness to percussion of frontal and maxillary sinuses.    Mouth/Throat:     Mouth:  Mucous membranes are moist.     Pharynx: Oropharynx is clear. No posterior oropharyngeal erythema.  Cardiovascular:     Rate and Rhythm: Normal rate and regular rhythm.     Pulses: Normal pulses.     Heart sounds: Normal heart sounds. No murmur  heard.   No gallop.  Pulmonary:     Effort: Pulmonary effort is normal.     Breath sounds: No wheezing, rhonchi or rales.  Musculoskeletal:     Cervical back: Normal range of motion and neck supple.  Lymphadenopathy:     Cervical: No cervical adenopathy.  Skin:    General: Skin is warm and dry.     Capillary Refill: Capillary refill takes less than 2 seconds.     Findings: No erythema or rash.  Neurological:     General: No focal deficit present.     Mental Status: She is alert and oriented to person, place, and time.  Psychiatric:        Mood and Affect: Mood normal.        Behavior: Behavior normal.        Thought Content: Thought content normal.        Judgment: Judgment normal.     UC Treatments / Results  Labs (all labs ordered are listed, but only abnormal results are displayed) Labs Reviewed - No data to display  EKG   Radiology No results found.  Procedures Procedures (including critical care time)  Medications Ordered in UC Medications - No data to display  Initial Impression / Assessment and Plan / UC Course  I have reviewed the triage vital signs and the nursing notes.  Pertinent labs & imaging results that were available during my care of the patient were reviewed by me and considered in my medical decision making (see chart for details).  Patient is a nontoxic-appearing 50 year old female here for evaluation of sinus complaints that been going on for the past week with development of chest congestion and cough yesterday.  Patient's physical exam reveals protegrin tympanic membranes bilaterally with a normal light reflex and clear external auditory canals.  Nasal mucosa is erythematous and edematous with purulent  discharge clinging to the walls of both nares.  Patient does have tenderness to percussion of frontal and maxillary sinuses bilaterally.  Oropharyngeal exam is benign.  No cervical lymphadenopathy appreciated exam.  Cardiopulmonary exam reveals clear lung sounds in all fields.  We will treat patient for maxillary sinusitis with Augmentin twice daily for 14 days.  Advised patient that if she does not improve in her symptoms within 48 hours of antibiotics she should fill the prednisone to start taking it.  We will also encourage her to perform sinus irrigation to help alleviate her symptoms.   Final Clinical Impressions(s) / UC Diagnoses   Final diagnoses:  Acute non-recurrent maxillary sinusitis     Discharge Instructions      The Augmentin twice daily with food for 14 days for treatment of your sinusitis.  If you do not have any improvement of your sinus symptoms after 48 hours fill the prednisone and start taking it.  Perform sinus irrigation 2-3 times a day with a NeilMed sinus rinse kit and distilled water.  Do not use tap water.  You can use plain over-the-counter Mucinex every 6 hours to break up the stickiness of the mucus so your body can clear it.  Increase your oral fluid intake to thin out your mucus so that is also able for your body to clear more easily.  Take an over-the-counter probiotic, such as Culturelle-align-activia, 1 hour after each dose of antibiotic to prevent diarrhea.  If you develop any new or worsening symptoms return for reevaluation or see your primary care provider.      ED Prescriptions  Medication Sig Dispense Auth. Provider   amoxicillin-clavulanate (AUGMENTIN) 875-125 MG tablet Take 1 tablet by mouth every 12 (twelve) hours for 14 days. 28 tablet Margarette Canada, NP   predniSONE (DELTASONE) 50 MG tablet Take 1 tablet daily by mouth for 5 days. 5 tablet Margarette Canada, NP      PDMP not reviewed this encounter.   Margarette Canada, NP 10/12/20  1247

## 2020-10-12 NOTE — ED Triage Notes (Signed)
Patient c/o sinus pressure and HAs for a week.  Patient reports cough and chest congestion that started yesterday.  Patient denies fevers.

## 2020-12-08 ENCOUNTER — Ambulatory Visit
Admission: EM | Admit: 2020-12-08 | Discharge: 2020-12-08 | Discharge status: Against Medical Advice | Payer: BC Managed Care – PPO

## 2020-12-08 ENCOUNTER — Other Ambulatory Visit: Payer: Self-pay

## 2020-12-08 ENCOUNTER — Ambulatory Visit
Admission: EM | Admit: 2020-12-08 | Discharge: 2020-12-08 | Disposition: A | Discharge status: Home / Self Care | Payer: BC Managed Care – PPO | Attending: Emergency Medicine | Admitting: Emergency Medicine

## 2020-12-08 DIAGNOSIS — J111 Influenza due to unidentified influenza virus with other respiratory manifestations: Secondary | ICD-10-CM

## 2020-12-08 DIAGNOSIS — Z20828 Contact with and (suspected) exposure to other viral communicable diseases: Secondary | ICD-10-CM | POA: Diagnosis not present

## 2020-12-08 MED ORDER — IBUPROFEN 600 MG PO TABS: 600.0000 mg | ORAL_TABLET | Freq: Four times a day (QID) | ORAL | 0 refills | Status: DC | PRN

## 2020-12-08 MED ORDER — OSELTAMIVIR PHOSPHATE 75 MG PO CAPS: 75.0000 mg | ORAL_CAPSULE | Freq: Two times a day (BID) | ORAL | 0 refills | Status: DC

## 2020-12-08 NOTE — ED Provider Notes (Signed)
HPI  SUBJECTIVE:  Hannah Rodgers is a 50 y.o. female who presents with the acute onset of body aches, fevers T-max 100, chills, diarrhea, headache, nasal congestion, sinus pain and pressure, mild cough starting yesterday.  No rhinorrhea, sore throat, postnasal drip, loss of sense of smell or taste, nausea, vomiting, diarrhea, abdominal pain.  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  No recent COVID exposure.  She got the second COVID vaccine.  She has not yet gotten the flu vaccine.  She took Tylenol 1000 mg with improvement in her symptoms.  No aggravating factors.  Last dose of Tylenol was in 6 hours or evaluation.  She had COVID 2 years ago.  PMD: Maryland Pink, MD     Past Medical History:  Diagnosis Date   Anxiety    Depression    Skin cancer 2020   nose, arms    Past Surgical History:  Procedure Laterality Date   ABDOMINAL HYSTERECTOMY     CESAREAN SECTION     x2   CYSTOSCOPY N/A 07/04/2018   Procedure: CYSTOSCOPY;  Surgeon: Benjaman Kindler, MD;  Location: [facility];  Service: Gynecology;  Laterality: N/A;   LAPAROSCOPIC BILATERAL SALPINGECTOMY Bilateral 07/04/2018   Procedure: LAPAROSCOPIC BILATERAL SALPINGECTOMY;  Surgeon: Benjaman Kindler, MD;  Location: [facility];  Service: Gynecology;  Laterality: Bilateral;   LAPAROSCOPIC HYSTERECTOMY N/A 07/04/2018   Procedure: HYSTERECTOMY TOTAL LAPAROSCOPIC;  Surgeon: Benjaman Kindler, MD;  Location: [facility];  Service: Gynecology;  Laterality: N/A;   TONSILLECTOMY      Family History  Problem Relation Age of Onset   Hypertension Mother    Hypertension Father    Stroke Father    Breast cancer Neg Hx     Social History   Tobacco Use   Smoking status: Every Day    Packs/day: 0.25    Types: Cigarettes   Smokeless tobacco: Never  Vaping Use   Vaping Use: Never used  Substance Use Topics   Alcohol use: No   Drug use: No    No current facility-administered medications for this encounter.  Current  Outpatient Medications:    acetaminophen (TYLENOL) 500 MG tablet, TAKE 2 TABLETS (1,000 MG TOTAL) BY MOUTH EVERY 6 (SIX) HOURS FOR 3 DAYS., Disp: , Rfl:    fluticasone (FLONASE) 50 MCG/ACT nasal spray, Place 2 sprays into both nostrils daily., Disp: , Rfl:    ibuprofen (ADVIL) 600 MG tablet, Take 1 tablet (600 mg total) by mouth every 6 (six) hours as needed., Disp: 30 tablet, Rfl: 0   montelukast (SINGULAIR) 10 MG tablet, Take 10 mg by mouth at bedtime., Disp: , Rfl:    Nutritional Supplements (ESTROVEN PO), Take 1 tablet by mouth daily., Disp: , Rfl:    oseltamivir (TAMIFLU) 75 MG capsule, Take 1 capsule (75 mg total) by mouth 2 (two) times daily. X 5 days, Disp: 10 capsule, Rfl: 0   sertraline (ZOLOFT) 100 MG tablet, Take 150 mg by mouth at bedtime. , Disp: , Rfl:    albuterol (VENTOLIN HFA) 108 (90 Base) MCG/ACT inhaler, Inhale 1-2 puffs into the lungs every 6 (six) hours as needed for wheezing or shortness of breath., Disp: 8 g, Rfl: 0  No Known Allergies   ROS  As noted in HPI.   Physical Exam  BP 123/70 (BP Location: Left Arm)   Pulse 76   Temp 98.3 F (36.8 C) (Oral)   Ht 5\' 6"  (1.676 m)   Wt 81.6 kg   SpO2 98%  BMI 29.05 kg/m   Constitutional: Well developed, well nourished, no acute distress Eyes:  EOMI, conjunctiva normal bilaterally HENT: Normocephalic, atraumatic,mucus membranes moist.  Positive nasal congestion.  No maxillary, frontal sinus tenderness. Neck: No cervical lymphadenopathy Respiratory: Normal inspiratory effort, lungs clear bilaterally, good air movement Cardiovascular: Normal rate, regular rhythm, no murmurs rubs or gallops GI: nondistended skin: No rash, skin intact Musculoskeletal: no deformities Neurologic: Alert & oriented x 3, no focal neuro deficits Psychiatric: Speech and behavior appropriate   ED Course   Medications - No data to display  No orders of the defined types were placed in this encounter.   No results found for this or  any previous visit (from the past 24 hour(s)). No results found.  ED Clinical Impression  1. Influenza   2. Exposure to influenza      ED Assessment/Plan  We discussed doing COVID and flu testing, however, she does not qualify for COVID antivirals because she has no comorbidities and is fully vaccinated.  She denies any recent COVID exposure, but has had multiple contacts with influenza.  Suspect influenza.  We decided to defer testing today because it would not change management-sending home empirically on Tamiflu.  In the meantime, Mucinex D, saline nasal irrigation, Tylenol/ibuprofen, Flonase, work note.  Follow-up PMD as needed.  To the ER if she gets worse.   Discussed MDM, treatment plan, and plan for follow-up with patient.  patient agrees with plan.   Meds ordered this encounter  Medications   oseltamivir (TAMIFLU) 75 MG capsule    Sig: Take 1 capsule (75 mg total) by mouth 2 (two) times daily. X 5 days    Dispense:  10 capsule    Refill:  0   ibuprofen (ADVIL) 600 MG tablet    Sig: Take 1 tablet (600 mg total) by mouth every 6 (six) hours as needed.    Dispense:  30 tablet    Refill:  0      *This clinic note was created using [employer]. Therefore, there may be occasional mistakes despite careful proofreading.  ?    Melynda Ripple, MD 12/09/20 [phone]

## 2020-12-08 NOTE — Discharge Instructions (Addendum)
Finish Tamiflu, even if you feel better.  Mucinex D, saline nasal irrigation with a NeilMed sinus rinse and distilled water as often as you want, Flonase, 600 mg of ibuprofen combined with 1000 mg Tylenol 3-4 times a day.

## 2020-12-08 NOTE — ED Triage Notes (Signed)
Pt here with C/O fever, headache, cough since waking up yesterday. 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

## 2021-03-30 ENCOUNTER — Ambulatory Visit (INDEPENDENT_AMBULATORY_CARE_PROVIDER_SITE_OTHER): Payer: BC Managed Care – PPO

## 2021-03-30 ENCOUNTER — Ambulatory Visit
Admission: EM | Admit: 2021-03-30 | Discharge: 2021-03-30 | Disposition: A | Discharge status: Home / Self Care | Payer: BC Managed Care – PPO | Attending: Emergency Medicine | Admitting: Emergency Medicine

## 2021-03-30 ENCOUNTER — Other Ambulatory Visit: Payer: Self-pay

## 2021-03-30 ENCOUNTER — Encounter: Payer: Self-pay | Admitting: Emergency Medicine

## 2021-03-30 DIAGNOSIS — Z20822 Contact with and (suspected) exposure to covid-19: Secondary | ICD-10-CM | POA: Insufficient documentation

## 2021-03-30 DIAGNOSIS — R079 Chest pain, unspecified: Secondary | ICD-10-CM

## 2021-03-30 DIAGNOSIS — J40 Bronchitis, not specified as acute or chronic: Secondary | ICD-10-CM | POA: Insufficient documentation

## 2021-03-30 DIAGNOSIS — J069 Acute upper respiratory infection, unspecified: Secondary | ICD-10-CM | POA: Insufficient documentation

## 2021-03-30 DIAGNOSIS — R059 Cough, unspecified: Secondary | ICD-10-CM | POA: Diagnosis present

## 2021-03-30 LAB — RESP PANEL BY RT-PCR (FLU A&B, COVID) ARPGX2
Influenza A by PCR: NEGATIVE
Influenza B by PCR: NEGATIVE
SARS Coronavirus 2 by RT PCR: NEGATIVE

## 2021-03-30 MED ORDER — ALBUTEROL SULFATE HFA 108 (90 BASE) MCG/ACT IN AERS: 1.0000 | INHALATION_SPRAY | RESPIRATORY_TRACT | 0 refills | Status: DC | PRN

## 2021-03-30 MED ORDER — AEROCHAMBER PLUS MISC: 2 refills | Status: DC

## 2021-03-30 MED ORDER — IBUPROFEN 600 MG PO TABS: 600.0000 mg | ORAL_TABLET | Freq: Four times a day (QID) | ORAL | 0 refills | Status: DC | PRN

## 2021-03-30 MED ORDER — IPRATROPIUM-ALBUTEROL 0.5-2.5 (3) MG/3ML IN SOLN
3.0000 mL | Freq: Once | RESPIRATORY_TRACT | Status: AC
  Administered 2021-03-30: 3 mL via RESPIRATORY_TRACT

## 2021-03-30 MED ORDER — PREDNISONE 20 MG PO TABS: 40.0000 mg | ORAL_TABLET | Freq: Every day | ORAL | 0 refills | Status: AC

## 2021-03-30 MED ORDER — FLUTICASONE PROPIONATE 50 MCG/ACT NA SUSP: 2.0000 | Freq: Every day | NASAL | 0 refills | Status: DC

## 2021-03-30 NOTE — Discharge Instructions (Addendum)
Your chest x-ray was negative for pneumonia, your COVID and flu are negative.  I am treating you as a bronchitis with prednisone, 600 mg of ibuprofen, 1000 mg of Tylenol together 3-4 times a day as needed for body aches, chest pain/soreness.  2 puffs from your albuterol inhaler every 4 hours for 2 days, and then every 6 hours for 2 days, then as needed.  You may back off on the albuterol if you start to feel better. ?

## 2021-03-30 NOTE — ED Triage Notes (Signed)
Patient c/o sinus pressure, HAs, cough, and post nasal drainage that started on Thursday.  Patient denies fevers.  ?

## 2021-03-30 NOTE — ED Provider Notes (Signed)
HPI  SUBJECTIVE:  Hannah Rodgers is a 51 y.o. female who presents with 4 days of dry cough, intermittent chest pain/pressure, as if "something is sitting on my chest", headache, sinus pain and pressure, body aches, nasal congestion, sore throat, postnasal drip.  No fevers, rhinorrhea, loss of sense of smell or taste, wheezing, shortness of breath, nausea, vomiting, diarrhea, abdominal pain, facial swelling, upper dental pain.  Her chest pain is worse with deep inspiration and with lying down on her back.  There is no exertional component.  It does not radiate up her neck, down her arm, or through to her back, she denies accompanying diaphoresis, nausea.  She states that she has had identical chest pain with previous episodes of bronchitis.  No known COVID or flu exposure.  She got the second dose of the COVID-vaccine.  She did not get this years flu vaccine.  No antibiotics in the past month.  No antipyretic in the past 6 hours.  She is able to sleep at night without waking up coughing.  She has been taking Tylenol severe sinus with improvement in her headache.  No aggravating factors.  She is a smoker and had influenza in November 22.  No history of pulmonary disease, diabetes, coronary disease, hypercholesterolemia, MI.  Family history negative for early MI.  [facility]   Past Medical History:  Diagnosis Date   Anxiety    Depression    Skin cancer 2020   nose, arms    Past Surgical History:  Procedure Laterality Date   ABDOMINAL HYSTERECTOMY     CESAREAN SECTION     x2   CYSTOSCOPY N/A 07/04/2018   Procedure: CYSTOSCOPY;  Surgeon: Benjaman Kindler, MD;  Location: [facility];  Service: Gynecology;  Laterality: N/A;   LAPAROSCOPIC BILATERAL SALPINGECTOMY Bilateral 07/04/2018   Procedure: LAPAROSCOPIC BILATERAL SALPINGECTOMY;  Surgeon: Benjaman Kindler, MD;  Location: [facility];  Service: Gynecology;  Laterality: Bilateral;   LAPAROSCOPIC HYSTERECTOMY N/A 07/04/2018   Procedure:  HYSTERECTOMY TOTAL LAPAROSCOPIC;  Surgeon: Benjaman Kindler, MD;  Location: [facility];  Service: Gynecology;  Laterality: N/A;   TONSILLECTOMY      Family History  Problem Relation Age of Onset   Hypertension Mother    Hypertension Father    Stroke Father    Breast cancer Neg Hx     Social History   Tobacco Use   Smoking status: Every Day    Packs/day: 0.25    Types: Cigarettes   Smokeless tobacco: Never  Vaping Use   Vaping Use: Never used  Substance Use Topics   Alcohol use: No   Drug use: No    No current facility-administered medications for this encounter.  Current Outpatient Medications:    albuterol (VENTOLIN HFA) 108 (90 Base) MCG/ACT inhaler, Inhale 1-2 puffs into the lungs every 4 (four) hours as needed for wheezing or shortness of breath., Disp: 1 each, Rfl: 0   fluticasone (FLONASE) 50 MCG/ACT nasal spray, Place 2 sprays into both nostrils daily., Disp: 16 g, Rfl: 0   ibuprofen (ADVIL) 600 MG tablet, Take 1 tablet (600 mg total) by mouth every 6 (six) hours as needed., Disp: 30 tablet, Rfl: 0   predniSONE (DELTASONE) 20 MG tablet, Take 2 tablets (40 mg total) by mouth daily with breakfast for 5 days., Disp: 10 tablet, Rfl: 0   sertraline (ZOLOFT) 100 MG tablet, Take 150 mg by mouth at bedtime. , Disp: , Rfl:    Spacer/Aero-Holding Chambers (AEROCHAMBER PLUS) inhaler, Use with inhaler, Disp:  1 each, Rfl: 2   acetaminophen (TYLENOL) 500 MG tablet, TAKE 2 TABLETS (1,000 MG TOTAL) BY MOUTH EVERY 6 (SIX) HOURS FOR 3 DAYS., Disp: , Rfl:    montelukast (SINGULAIR) 10 MG tablet, Take 10 mg by mouth at bedtime., Disp: , Rfl:    Nutritional Supplements (ESTROVEN PO), Take 1 tablet by mouth daily., Disp: , Rfl:   No Known Allergies   ROS  As noted in HPI.   Physical Exam  BP 135/90 (BP Location: Left Arm)    Pulse 75    Temp 98.1 F (36.7 C) (Oral)    Resp 14    Ht '5\' 6"'$  (1.676 m)    Wt 79.8 kg    SpO2 98%    BMI 28.41 kg/m   Constitutional: Well developed, well  nourished, no acute distress Eyes:  EOMI, conjunctiva normal bilaterally HENT: Normocephalic, atraumatic,mucus membranes moist.  Positive nasal congestion.  Normal turbinates.  No maxillary, frontal sinus tenderness.  Tonsils absent.  No obvious postnasal drip.  Positive cobblestoning. Neck: Positive cervical lymphadenopathy Respiratory: Normal inspiratory effort, lungs clear bilaterally Cardiovascular: Normal rate, regular rhythm, no murmurs, walls, gallops.  Positive reproducible chest wall tenderness along the bilateral sternum.  No costochondral junction tenderness.  Positive lateral chest wall tenderness. GI: nondistended skin: No rash, skin intact Musculoskeletal: no deformities Neurologic: Alert & oriented x 3, no focal neuro deficits Psychiatric: Speech and behavior appropriate   ED Course   Medications  ipratropium-albuterol (DUONEB) 0.5-2.5 (3) MG/3ML nebulizer solution 3 mL (3 mLs Nebulization Given 03/30/21 1147)    Orders Placed This Encounter  Procedures   Resp Panel by RT-PCR (Flu A&B, Covid) Nasopharyngeal Swab    Standing Status:   Standing    Number of Occurrences:   1   DG Chest 2 View    Standing Status:   Standing    Number of Occurrences:   1    Order Specific Question:   Reason for Exam (SYMPTOM  OR DIAGNOSIS REQUIRED)    Answer:   chest pain   Airborne and Contact precautions    Standing Status:   Standing    Number of Occurrences:   1   ED EKG    Standing Status:   Standing    Number of Occurrences:   1    Order Specific Question:   Reason for Exam    Answer:   Chest Pain   EKG 12-Lead    Standing Status:   Standing    Number of Occurrences:   1    Results for orders placed or performed during the hospital encounter of 03/30/21 (from the past 24 hour(s))  Resp Panel by RT-PCR (Flu A&B, Covid) Nasopharyngeal Swab     Status: None   Collection Time: 03/30/21 10:55 AM   Specimen: Nasopharyngeal Swab; Nasopharyngeal(NP) swabs in vial transport medium   Result Value Ref Range   SARS Coronavirus 2 by RT PCR NEGATIVE NEGATIVE   Influenza A by PCR NEGATIVE NEGATIVE   Influenza B by PCR NEGATIVE NEGATIVE   DG Chest 2 View  Result Date: 03/30/2021 CLINICAL DATA:  Cough and chest pain. EXAM: CHEST - 2 VIEW COMPARISON:  10/08/2019 and prior radiographs FINDINGS: The cardiomediastinal silhouette is unremarkable. There is no evidence of focal airspace disease, pulmonary edema, suspicious pulmonary nodule/mass, pleural effusion, or pneumothorax. No acute bony abnormalities are identified. A remote LEFT clavicle fracture is noted. IMPRESSION: No active cardiopulmonary disease. Electronically Signed   By: Cleatis Polka.D.  On: 03/30/2021 11:46    ED Clinical Impression  1. Upper respiratory tract infection, unspecified type   2. Bronchitis      ED Assessment/Plan  Checking EKG because of the chest pressure, although she does have reproducible chest wall pain.  I suspect it is coming from increased work of breathing from bronchospasm.  Also obtaining x-ray, flu, COVID.  Will give nebulizer treatment.  EKG: Normal sinus rhythm, rate 70.  Normal axis, normal intervals.  No hypertrophy.  No ST-T wave changes.  No changes from EKG from 6/21.  Reviewed imaging independently.  Remote left clavicle fracture.  No active cardiopulmonary disease.  See radiology report for full details.  COVID, flu PCR negative.  She has a normal chest x-ray, her EKG is reassuring.  On reevaluation, patient states she feels better after the nebulizer treatment.  She has improved air movement.  We will treat as bronchitis with prednisone 40 mg for 5 days, regularly scheduled albuterol inhaler with a spacer for the next 4 days, Tylenol/ibuprofen, Flonase.  Follow-up with PCP as needed.  ER return precautions given  Discussed labs, imaging, MDM, treatment plan, and plan for follow-up with patient. Discussed sn/sx that should prompt return to the ED. patient agrees with plan.    Meds ordered this encounter  Medications   ipratropium-albuterol (DUONEB) 0.5-2.5 (3) MG/3ML nebulizer solution 3 mL   albuterol (VENTOLIN HFA) 108 (90 Base) MCG/ACT inhaler    Sig: Inhale 1-2 puffs into the lungs every 4 (four) hours as needed for wheezing or shortness of breath.    Dispense:  1 each    Refill:  0   fluticasone (FLONASE) 50 MCG/ACT nasal spray    Sig: Place 2 sprays into both nostrils daily.    Dispense:  16 g    Refill:  0   Spacer/Aero-Holding Chambers (AEROCHAMBER PLUS) inhaler    Sig: Use with inhaler    Dispense:  1 each    Refill:  2    Please educate patient on use   predniSONE (DELTASONE) 20 MG tablet    Sig: Take 2 tablets (40 mg total) by mouth daily with breakfast for 5 days.    Dispense:  10 tablet    Refill:  0   ibuprofen (ADVIL) 600 MG tablet    Sig: Take 1 tablet (600 mg total) by mouth every 6 (six) hours as needed.    Dispense:  30 tablet    Refill:  0      *This clinic note was created using [employer]. Therefore, there may be occasional mistakes despite careful proofreading.  ?    Melynda Ripple, MD 03/30/21 1156

## 2021-06-24 IMAGING — MG DIGITAL SCREENING BILAT W/ TOMO W/ CAD
8 series · 8 of 24 positions shown · non-contrast
Comparison: Previous exam(s).

CLINICAL DATA: Screening.

EXAM:
DIGITAL SCREENING BILATERAL MAMMOGRAM WITH TOMO AND CAD

[R MLO synth-2D]
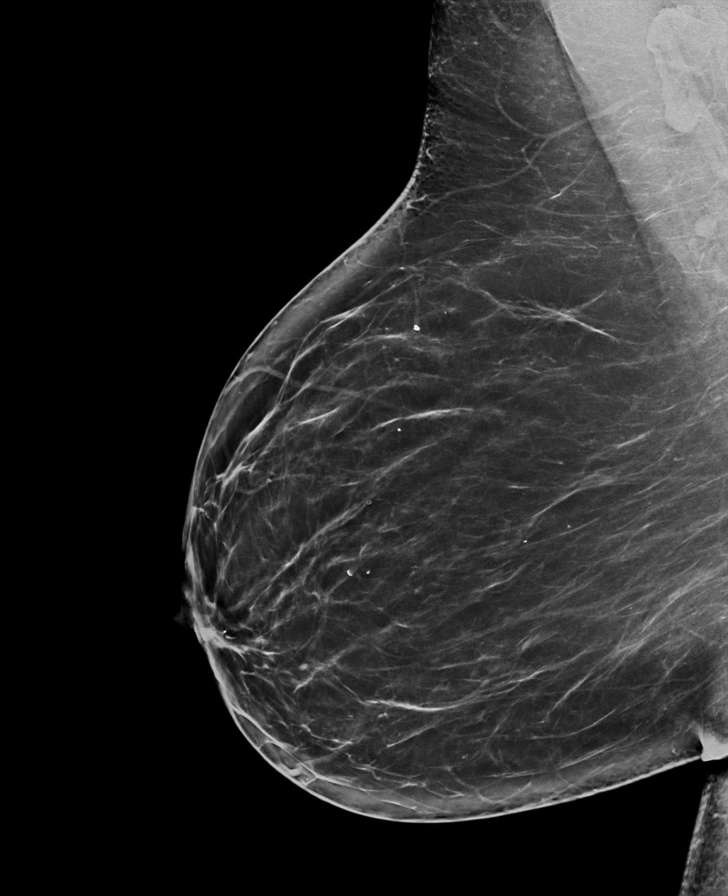

[L MLO synth-2D]
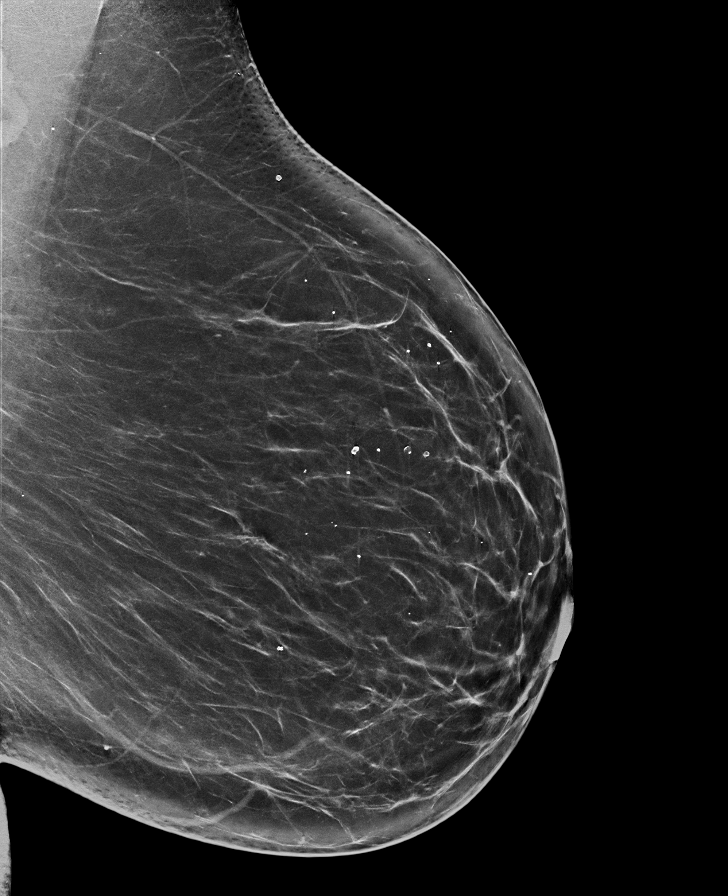

[R CC synth-2D]
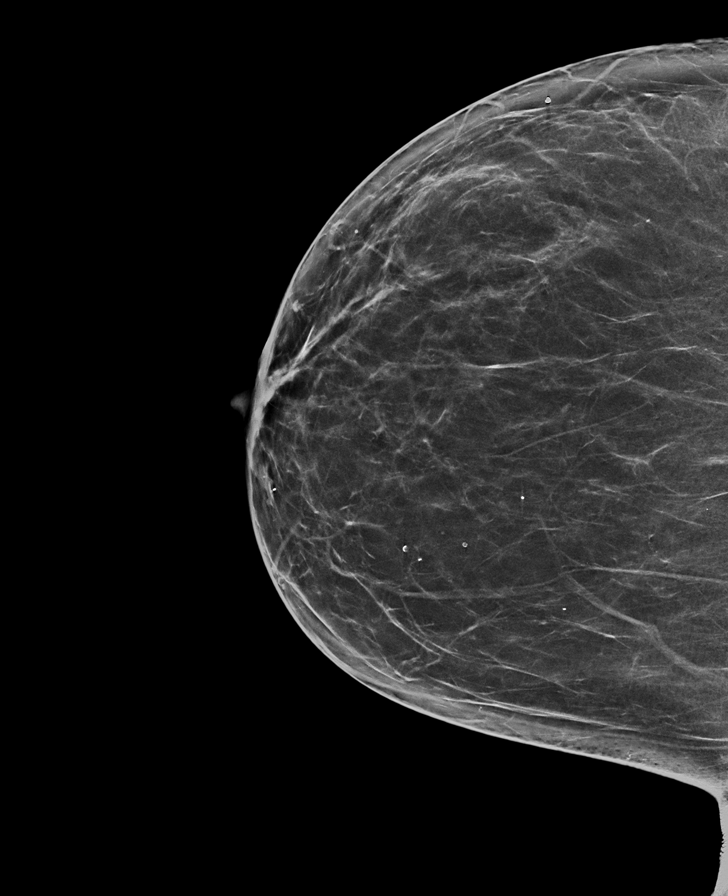

[L CC synth-2D]
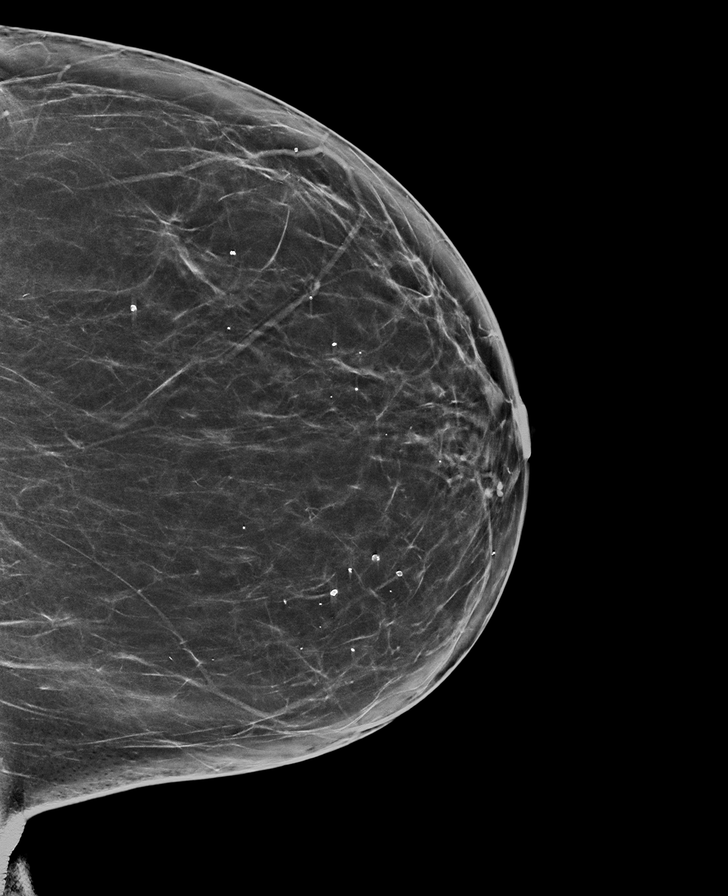

[L MLO tomo · tomo slice 44/87.0]
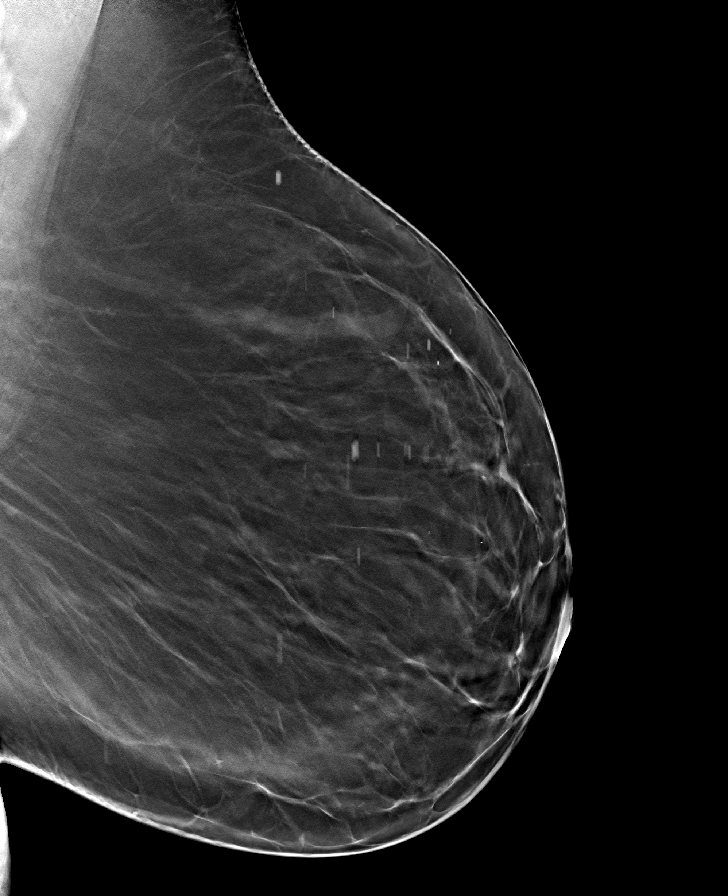

[R CC tomo · tomo slice 37/72.0]
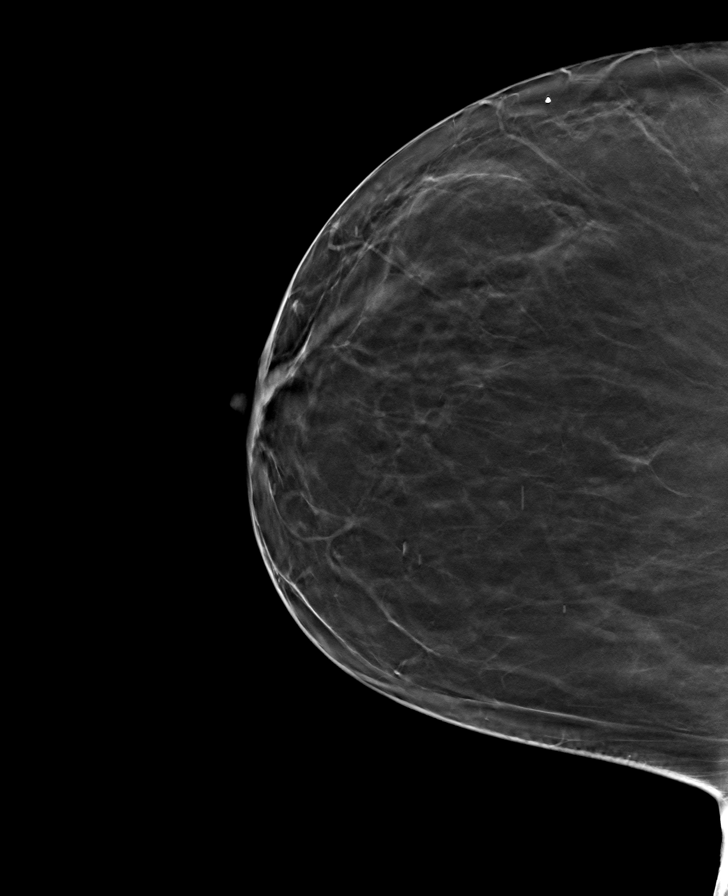

[R MLO tomo · tomo slice 45/89.0]
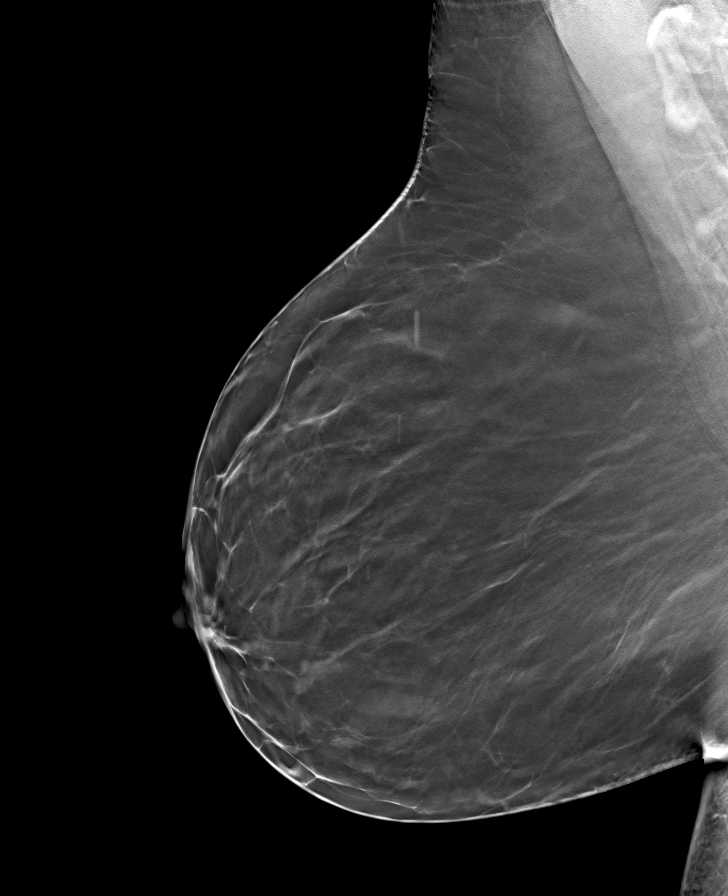

[L CC tomo · tomo slice 41/80.0]
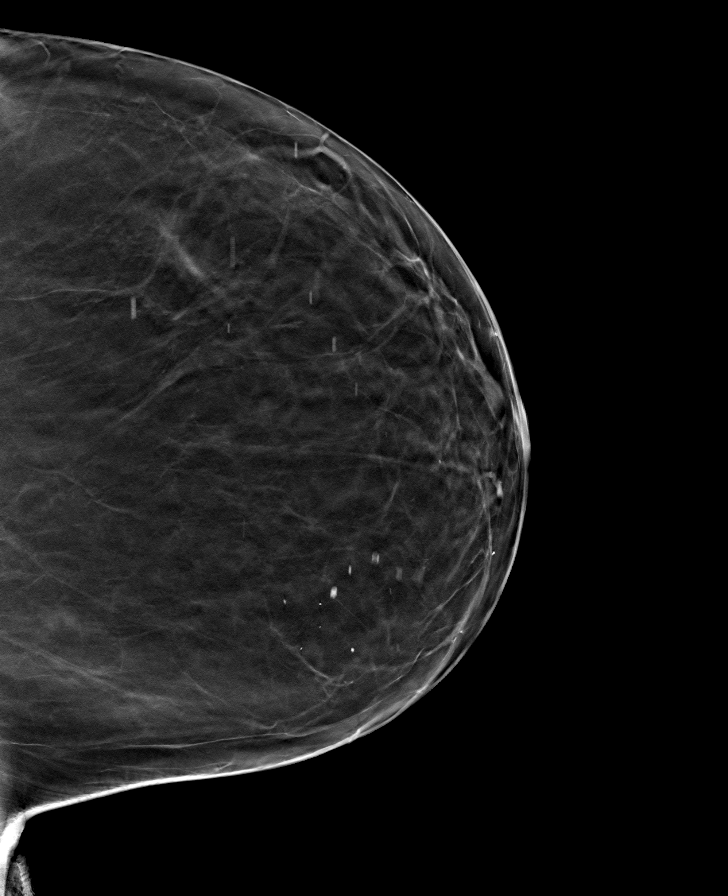

[8 of 24 positions shown; findings below may reference images not displayed]

ACR Breast Density Category b: There are scattered areas of
fibroglandular density.
FINDINGS: There are no findings suspicious for malignancy. Images were
processed with CAD.
IMPRESSION: No mammographic evidence of malignancy. A result letter of this
screening mammogram will be mailed directly to the patient.

RECOMMENDATION:
Screening mammogram in one year. (Code:[id number]-U-775)

BI-RADS CATEGORY  1: Negative.

## 2021-09-23 ENCOUNTER — Encounter: Payer: Self-pay | Admitting: Emergency Medicine

## 2021-09-23 ENCOUNTER — Ambulatory Visit
Admission: EM | Admit: 2021-09-23 | Discharge: 2021-09-23 | Disposition: A | Discharge status: Home / Self Care | Payer: BC Managed Care – PPO | Attending: Emergency Medicine | Admitting: Emergency Medicine

## 2021-09-23 DIAGNOSIS — R0981 Nasal congestion: Secondary | ICD-10-CM

## 2021-09-23 DIAGNOSIS — J069 Acute upper respiratory infection, unspecified: Secondary | ICD-10-CM

## 2021-09-23 MED ORDER — PSEUDOEPH-BROMPHEN-DM 30-2-10 MG/5ML PO SYRP: 5.0000 mL | ORAL_SOLUTION | Freq: Four times a day (QID) | ORAL | 0 refills | Status: DC | PRN

## 2021-09-23 NOTE — Discharge Instructions (Addendum)
Take the Bromphed DM as directed.  You can take Tylenol or ibuprofen also.  Follow up with your primary care provider if your symptoms are not improving.

## 2021-09-23 NOTE — ED Provider Notes (Signed)
Hannah Rodgers    CSN: [id number] Arrival date & time: 09/23/21  1513      History   Chief Complaint Chief Complaint  Patient presents with   Nasal Congestion   Headache    HPI Hannah Rodgers is a 51 y.o. female.  Patient presents with 2-day history of nasal congestion, sinus pressure, sinus headache, runny nose, postnasal drip, mild cough.  No fever, chills, earache, sore throat, chest pain, shortness of breath, vomiting, diarrhea, or other symptoms.  Treatment at home with Sudafed and DayQuil.  No pertinent medical history.  The history is provided by the patient and medical records.    Past Medical History:  Diagnosis Date   Anxiety    Depression    Skin cancer 2020   nose, arms    There are no problems to display for this patient.   Past Surgical History:  Procedure Laterality Date   ABDOMINAL HYSTERECTOMY     CESAREAN SECTION     x2   CYSTOSCOPY N/A 07/04/2018   Procedure: CYSTOSCOPY;  Surgeon: Benjaman Kindler, MD;  Location: [facility];  Service: Gynecology;  Laterality: N/A;   LAPAROSCOPIC BILATERAL SALPINGECTOMY Bilateral 07/04/2018   Procedure: LAPAROSCOPIC BILATERAL SALPINGECTOMY;  Surgeon: Benjaman Kindler, MD;  Location: [facility];  Service: Gynecology;  Laterality: Bilateral;   LAPAROSCOPIC HYSTERECTOMY N/A 07/04/2018   Procedure: HYSTERECTOMY TOTAL LAPAROSCOPIC;  Surgeon: Benjaman Kindler, MD;  Location: [facility];  Service: Gynecology;  Laterality: N/A;   TONSILLECTOMY      OB History   No obstetric history on file.      Home Medications    Prior to Admission medications   Medication Sig Start Date End Date Taking? Authorizing Provider  brompheniramine-pseudoephedrine-DM 30-2-10 MG/5ML syrup Take 5 mLs by mouth 4 (four) times daily as needed. 09/23/21  Yes Sharion Balloon, NP  acetaminophen (TYLENOL) 500 MG tablet TAKE 2 TABLETS (1,000 MG TOTAL) BY MOUTH EVERY 6 (SIX) HOURS FOR 3 DAYS. 07/04/18   [provider]  albuterol (VENTOLIN  HFA) 108 (90 Base) MCG/ACT inhaler Inhale 1-2 puffs into the lungs every 4 (four) hours as needed for wheezing or shortness of breath. 03/30/21   Melynda Ripple, MD  fluticasone (FLONASE) 50 MCG/ACT nasal spray Place 2 sprays into both nostrils daily. 03/30/21   Melynda Ripple, MD  ibuprofen (ADVIL) 600 MG tablet Take 1 tablet (600 mg total) by mouth every 6 (six) hours as needed. 03/30/21   Melynda Ripple, MD  montelukast (SINGULAIR) 10 MG tablet Take 10 mg by mouth at bedtime.    [provider]  Nutritional Supplements (ESTROVEN PO) Take 1 tablet by mouth daily.    [provider]  sertraline (ZOLOFT) 100 MG tablet Take 150 mg by mouth at bedtime.     [provider]  Spacer/Aero-Holding Chambers (AEROCHAMBER PLUS) inhaler Use with inhaler 03/30/21   Melynda Ripple, MD  fexofenadine (ALLEGRA) 180 MG tablet Take by mouth.  10/08/19  [provider]  gabapentin (NEURONTIN) 800 MG tablet Take 1 tablet (800 mg total) by mouth at bedtime for 14 days. Take nightly for 3 days, then up to 14 days as needed 07/04/18 07/08/19  Benjaman Kindler, MD    Family History Family History  Problem Relation Age of Onset   Hypertension Mother    Hypertension Father    Stroke Father    Breast cancer Neg Hx     Social History Social History   Tobacco Use   Smoking status: Every Day  Packs/day: 0.25    Types: Cigarettes   Smokeless tobacco: Never  Vaping Use   Vaping Use: Never used  Substance Use Topics   Alcohol use: No   Drug use: No     Allergies   Patient has no known allergies.   Review of Systems Review of Systems  Constitutional:  Negative for chills and fever.  HENT:  Positive for congestion, postnasal drip, rhinorrhea and sinus pressure. Negative for ear pain and sore throat.   Respiratory:  Positive for cough. Negative for shortness of breath.   Cardiovascular:  Negative for chest pain and palpitations.  Gastrointestinal:  Negative for  diarrhea and vomiting.  Skin:  Negative for color change and rash.  Neurological:  Positive for headaches.  All other systems reviewed and are negative.    Physical Exam Triage Vital Signs ED Triage Vitals  Enc Vitals Group     BP      Pulse      Resp      Temp      Temp src      SpO2      Weight      Height      Head Circumference      Peak Flow      Pain Score      Pain Loc      Pain Edu?      Excl. in [place]?    No data found.  Updated Vital Signs BP 121/62   Pulse 89   Temp 98.2 F (36.8 C)   Resp 18   SpO2 98%   Visual Acuity Right Eye Distance:   Left Eye Distance:   Bilateral Distance:    Right Eye Near:   Left Eye Near:    Bilateral Near:     Physical Exam Vitals and nursing note reviewed.  Constitutional:      General: She is not in acute distress.    Appearance: Normal appearance. She is well-developed. She is not ill-appearing.  HENT:     Right Ear: Tympanic membrane normal.     Left Ear: Tympanic membrane normal.     Nose: Congestion and rhinorrhea present.     Mouth/Throat:     Mouth: Mucous membranes are moist.     Pharynx: Oropharynx is clear.  Cardiovascular:     Rate and Rhythm: Normal rate and regular rhythm.     Heart sounds: Normal heart sounds.  Pulmonary:     Effort: Pulmonary effort is normal. No respiratory distress.     Breath sounds: Normal breath sounds.  Musculoskeletal:     Cervical back: Neck supple.  Skin:    General: Skin is warm and dry.  Neurological:     Mental Status: She is alert.  Psychiatric:        Mood and Affect: Mood normal.        Behavior: Behavior normal.      UC Treatments / Results  Labs (all labs ordered are listed, but only abnormal results are displayed) Labs Reviewed - No data to display  EKG   Radiology No results found.  Procedures Procedures (including critical care time)  Medications Ordered in UC Medications - No data to display  Initial Impression / Assessment and Plan /  UC Course  I have reviewed the triage vital signs and the nursing notes.  Pertinent labs & imaging results that were available during my care of the patient were reviewed by me and considered in my medical decision making (  see chart for details).   Viral URI, nasal congestion.  Afebrile, VSS.  Patient declines COVID test.  Treating with Bromphed DM.  Discussed other symptomatic treatment including Tylenol or ibuprofen as needed.  Instructed patient to follow up with her PCP if her symptoms are not improving.  She agrees to plan of care.     Final Clinical Impressions(s) / UC Diagnoses   Final diagnoses:  Viral URI  Nasal congestion     Discharge Instructions      Take the Bromphed DM as directed.  You can take Tylenol or ibuprofen also.  Follow up with your primary care provider if your symptoms are not improving.        ED Prescriptions     Medication Sig Dispense Auth. Provider   brompheniramine-pseudoephedrine-DM 30-2-10 MG/5ML syrup Take 5 mLs by mouth 4 (four) times daily as needed. 120 mL Sharion Balloon, NP      PDMP not reviewed this encounter.   Sharion Balloon, NP 09/23/21 [phone]

## 2021-10-12 ENCOUNTER — Encounter: Payer: Self-pay | Admitting: Emergency Medicine

## 2021-10-12 ENCOUNTER — Ambulatory Visit
Admission: EM | Admit: 2021-10-12 | Discharge: 2021-10-12 | Disposition: A | Discharge status: Home / Self Care | Payer: BC Managed Care – PPO | Attending: Family Medicine | Admitting: Family Medicine

## 2021-10-12 DIAGNOSIS — B9689 Other specified bacterial agents as the cause of diseases classified elsewhere: Secondary | ICD-10-CM | POA: Diagnosis not present

## 2021-10-12 DIAGNOSIS — J329 Chronic sinusitis, unspecified: Secondary | ICD-10-CM | POA: Diagnosis not present

## 2021-10-12 MED ORDER — AMOXICILLIN 875 MG PO TABS: 875.0000 mg | ORAL_TABLET | Freq: Two times a day (BID) | ORAL | 0 refills | Status: AC

## 2021-10-12 NOTE — ED Triage Notes (Signed)
Patient c/o headache, sinus pain and pressure, and right sided facial pain that her symptoms have gotten worse since being seen at [facility] on 09/23/21.  Patient denies fevers.

## 2021-10-12 NOTE — Discharge Instructions (Addendum)
Stop by the pharmacy to pick up your prescriptions.  Follow up with your primary care provider as needed.  

## 2021-10-12 NOTE — ED Provider Notes (Signed)
[facility]    CSN: [id number] Arrival date & time: 10/12/21  0854      History   Chief Complaint Chief Complaint  Patient presents with   Sinus Problem    HPI Hannah Rodgers is a 51 y.o. female.   HPI   Hannah Rodgers presents for right sided facial pain since 09/23/21. Has progressively gotten worse and Friday it felt like a throbbing pain "almost like a toothache."  Endorses headache, nasal congestion with occasional rhinorrhea.  Denies fevers, chills, diarrhea, rash, vomiting, chest pain, shortness of breath, abdominal pain. No recent sick contacts.    Fever : no  Chills: no Sore throat: no   Cough: no Nasal congestion : no  Rhinorrhea: no Myalgias: no Appetite: normal  Hydration: normal  Abdominal pain: no Vomiting: no Diarrhea: No Rash: No Sleep disturbance: no Headache: yes     Past Medical History:  Diagnosis Date   Anxiety    Depression    Skin cancer 2020   nose, arms    There are no problems to display for this patient.   Past Surgical History:  Procedure Laterality Date   ABDOMINAL HYSTERECTOMY     CESAREAN SECTION     x2   CYSTOSCOPY N/A 07/04/2018   Procedure: CYSTOSCOPY;  Surgeon: Benjaman Kindler, MD;  Location: [facility];  Service: Gynecology;  Laterality: N/A;   LAPAROSCOPIC BILATERAL SALPINGECTOMY Bilateral 07/04/2018   Procedure: LAPAROSCOPIC BILATERAL SALPINGECTOMY;  Surgeon: Benjaman Kindler, MD;  Location: [facility];  Service: Gynecology;  Laterality: Bilateral;   LAPAROSCOPIC HYSTERECTOMY N/A 07/04/2018   Procedure: HYSTERECTOMY TOTAL LAPAROSCOPIC;  Surgeon: Benjaman Kindler, MD;  Location: [facility];  Service: Gynecology;  Laterality: N/A;   TONSILLECTOMY      OB History   No obstetric history on file.      Home Medications    Prior to Admission medications   Medication Sig Start Date End Date Taking? Authorizing Provider  amoxicillin (AMOXIL) 875 MG tablet Take 1 tablet (875 mg total) by mouth 2 (two) times daily  for 7 days. 10/12/21 10/19/21 Yes Ernesto Zukowski, DO  acetaminophen (TYLENOL) 500 MG tablet TAKE 2 TABLETS (1,000 MG TOTAL) BY MOUTH EVERY 6 (SIX) HOURS FOR 3 DAYS. 07/04/18   [provider]  albuterol (VENTOLIN HFA) 108 (90 Base) MCG/ACT inhaler Inhale 1-2 puffs into the lungs every 4 (four) hours as needed for wheezing or shortness of breath. 03/30/21   Melynda Ripple, MD  brompheniramine-pseudoephedrine-DM 30-2-10 MG/5ML syrup Take 5 mLs by mouth 4 (four) times daily as needed. 09/23/21   Sharion Balloon, NP  fluticasone (FLONASE) 50 MCG/ACT nasal spray Place 2 sprays into both nostrils daily. 03/30/21   Melynda Ripple, MD  ibuprofen (ADVIL) 600 MG tablet Take 1 tablet (600 mg total) by mouth every 6 (six) hours as needed. 03/30/21   Melynda Ripple, MD  montelukast (SINGULAIR) 10 MG tablet Take 10 mg by mouth at bedtime.    [provider]  Nutritional Supplements (ESTROVEN PO) Take 1 tablet by mouth daily.    [provider]  sertraline (ZOLOFT) 100 MG tablet Take 150 mg by mouth at bedtime.     [provider]  Spacer/Aero-Holding Chambers (AEROCHAMBER PLUS) inhaler Use with inhaler 03/30/21   Melynda Ripple, MD  fexofenadine (ALLEGRA) 180 MG tablet Take by mouth.  10/08/19  [provider]  gabapentin (NEURONTIN) 800 MG tablet Take 1 tablet (800 mg total) by mouth at bedtime for 14 days. Take nightly for 3 days, then  up to 14 days as needed 07/04/18 07/08/19  Benjaman Kindler, MD    Family History Family History  Problem Relation Age of Onset   Hypertension Mother    Hypertension Father    Stroke Father    Breast cancer Neg Hx     Social History Social History   Tobacco Use   Smoking status: Every Day    Packs/day: 0.25    Types: Cigarettes   Smokeless tobacco: Never  Vaping Use   Vaping Use: Never used  Substance Use Topics   Alcohol use: No   Drug use: No     Allergies   Patient has no known allergies.   Review of  Systems Review of Systems: negative unless otherwise stated in HPI.      Physical Exam Triage Vital Signs ED Triage Vitals  Enc Vitals Group     BP 10/12/21 0908 138/85     Pulse Rate 10/12/21 0908 75     Resp 10/12/21 0908 14     Temp 10/12/21 0908 98.4 F (36.9 C)     Temp Source 10/12/21 0908 Oral     SpO2 10/12/21 0908 97 %     Weight 10/12/21 0905 185 lb (83.9 kg)     Height 10/12/21 0905 '5\' 6"'$  (1.676 m)     Head Circumference --      Peak Flow --      Pain Score 10/12/21 0905 10     Pain Loc --      Pain Edu? --      Excl. in [place]? --    No data found.  Updated Vital Signs BP 138/85 (BP Location: Left Arm)   Pulse 75   Temp 98.4 F (36.9 C) (Oral)   Resp 14   Ht '5\' 6"'$  (1.676 m)   Wt 83.9 kg   SpO2 97%   BMI 29.86 kg/m   Visual Acuity Right Eye Distance:   Left Eye Distance:   Bilateral Distance:    Right Eye Near:   Left Eye Near:    Bilateral Near:     Physical Exam GEN:     alert, non-toxic appearing female in no distress    HENT:  mucus membranes moist, oropharyngeal without lesions or exudate, no tonsillar hypertrophy, no oropharyngeal erythema, moderate erythematous hypertrophied turbinates, clear nasal discharge,  EYES:   pupils equal and reactive, EOMi, no scleral injection NECK:  normal ROM, no lymphadenopathy, no meningismus   RESP:  no increased work of breathing, clear to auscultation bilaterally,   CVS:   regular rate and rhythm Skin:   warm and dry    UC Treatments / Results  Labs (all labs ordered are listed, but only abnormal results are displayed) Labs Reviewed - No data to display  EKG   Radiology No results found.  Procedures Procedures (including critical care time)  Medications Ordered in UC Medications - No data to display  Initial Impression / Assessment and Plan / UC Course  I have reviewed the triage vital signs and the nursing notes.  Pertinent labs & imaging results that were available during my care of the  patient were reviewed by me and considered in my medical decision making (see chart for details).     Pt is a 51 y.o. female who presents for sinus congestion and pain for past 2-3 weeks. Hannah Rodgers is afebrile without recent use of antipyretics. Satting well on room air. Overall pt is well appearing, well hydrated, without respiratory distress. Treat with  amoxicillin. Rx sent to pharmacy. Discussed typical duration of symptoms. OTC symptom care as needed. Ensure adequate fluid intake and rest.  Reviewed expectations re: course of current medical issues. Questions answered. Return and ED precautions given.  Patient verbalized understanding. After Visit Summary given.  Discussed MDM, treatment plan and plan for follow-up with patient who agrees with plan.      Final Clinical Impressions(s) / UC Diagnoses   Final diagnoses:  Bacterial sinusitis     Discharge Instructions      Stop by the pharmacy to pick up your prescriptions.  Follow up with your primary care provider as needed.      ED Prescriptions     Medication Sig Dispense Auth. Provider   amoxicillin (AMOXIL) 875 MG tablet Take 1 tablet (875 mg total) by mouth 2 (two) times daily for 7 days. 14 tablet Damonte Frieson, Ronnette Juniper, DO      PDMP not reviewed this encounter.   Lyndee Hensen, DO 10/12/21 [phone]

## 2021-11-22 ENCOUNTER — Ambulatory Visit
Admission: EM | Admit: 2021-11-22 | Discharge: 2021-11-22 | Disposition: A | Discharge status: Home / Self Care | Payer: BC Managed Care – PPO | Attending: Family Medicine | Admitting: Family Medicine

## 2021-11-22 ENCOUNTER — Encounter: Payer: Self-pay | Admitting: Emergency Medicine

## 2021-11-22 DIAGNOSIS — J40 Bronchitis, not specified as acute or chronic: Secondary | ICD-10-CM

## 2021-11-22 DIAGNOSIS — J329 Chronic sinusitis, unspecified: Secondary | ICD-10-CM | POA: Diagnosis not present

## 2021-11-22 MED ORDER — AMOXICILLIN 875 MG PO TABS: 875.0000 mg | ORAL_TABLET | Freq: Two times a day (BID) | ORAL | 0 refills | Status: AC

## 2021-11-22 NOTE — ED Triage Notes (Signed)
Patient c/o sinus congestion and pressure, nasal congestion and cough that started a week ago.  Patient denies recent fevers.

## 2021-11-22 NOTE — ED Provider Notes (Signed)
[facility]    CSN: [id number] Arrival date & time: 11/22/21  1139      History   Chief Complaint Chief Complaint  Patient presents with   Sinus Problem   Nasal Congestion   Cough    HPI Hannah Rodgers is a 51 y.o. female.   HPI   [name] presents for sinus pressure, coughing, fatigue and nasal congestion that started about a week and a half ago.  She was sneezing really bad.  NyQuil and Dayquil help somewhat. She has been using Afrin for the last 5 days  She has been getting hot flashes but believes they are related to menopause. She has chills. She took Tylenol this morning around 11 AM.  No diarrhea, rash.  Coughing just started the other day.  Has some belly pain. Has some chest tightness but no true chest pain.  She has been sleeping more as she is so tired.     Past Medical History:  Diagnosis Date   Anxiety    Depression    Skin cancer 2020   nose, arms    There are no problems to display for this patient.   Past Surgical History:  Procedure Laterality Date   ABDOMINAL HYSTERECTOMY     CESAREAN SECTION     x2   CYSTOSCOPY N/A 07/04/2018   Procedure: CYSTOSCOPY;  Surgeon: Benjaman Kindler, MD;  Location: [facility];  Service: Gynecology;  Laterality: N/A;   LAPAROSCOPIC BILATERAL SALPINGECTOMY Bilateral 07/04/2018   Procedure: LAPAROSCOPIC BILATERAL SALPINGECTOMY;  Surgeon: Benjaman Kindler, MD;  Location: [facility];  Service: Gynecology;  Laterality: Bilateral;   LAPAROSCOPIC HYSTERECTOMY N/A 07/04/2018   Procedure: HYSTERECTOMY TOTAL LAPAROSCOPIC;  Surgeon: Benjaman Kindler, MD;  Location: [facility];  Service: Gynecology;  Laterality: N/A;   TONSILLECTOMY      OB History   No obstetric history on file.      Home Medications    Prior to Admission medications   Medication Sig Start Date End Date Taking? Authorizing Provider  amoxicillin (AMOXIL) 875 MG tablet Take 1 tablet (875 mg total) by mouth 2 (two) times daily for 7 days. 11/22/21  11/29/21 Yes Jasneet Schobert, DO  acetaminophen (TYLENOL) 500 MG tablet TAKE 2 TABLETS (1,000 MG TOTAL) BY MOUTH EVERY 6 (SIX) HOURS FOR 3 DAYS. 07/04/18   [provider]  albuterol (VENTOLIN HFA) 108 (90 Base) MCG/ACT inhaler Inhale 1-2 puffs into the lungs every 4 (four) hours as needed for wheezing or shortness of breath. 03/30/21   Melynda Ripple, MD  brompheniramine-pseudoephedrine-DM 30-2-10 MG/5ML syrup Take 5 mLs by mouth 4 (four) times daily as needed. 09/23/21   Sharion Balloon, NP  fluticasone (FLONASE) 50 MCG/ACT nasal spray Place 2 sprays into both nostrils daily. 03/30/21   Melynda Ripple, MD  ibuprofen (ADVIL) 600 MG tablet Take 1 tablet (600 mg total) by mouth every 6 (six) hours as needed. 03/30/21   Melynda Ripple, MD  montelukast (SINGULAIR) 10 MG tablet Take 10 mg by mouth at bedtime.    [provider]  Nutritional Supplements (ESTROVEN PO) Take 1 tablet by mouth daily.    [provider]  sertraline (ZOLOFT) 100 MG tablet Take 150 mg by mouth at bedtime.     [provider]  Spacer/Aero-Holding Chambers (AEROCHAMBER PLUS) inhaler Use with inhaler 03/30/21   Melynda Ripple, MD  fexofenadine (ALLEGRA) 180 MG tablet Take by mouth.  10/08/19  [provider]  gabapentin (NEURONTIN) 800 MG tablet Take 1 tablet (800 mg total)  by mouth at bedtime for 14 days. Take nightly for 3 days, then up to 14 days as needed 07/04/18 07/08/19  Benjaman Kindler, MD    Family History Family History  Problem Relation Age of Onset   Hypertension Mother    Hypertension Father    Stroke Father    Breast cancer Neg Hx     Social History Social History   Tobacco Use   Smoking status: Every Day    Packs/day: 0.25    Types: Cigarettes   Smokeless tobacco: Never  Vaping Use   Vaping Use: Never used  Substance Use Topics   Alcohol use: No   Drug use: No     Allergies   Patient has no known allergies.   Review of Systems Review of  Systems: negative unless otherwise stated in HPI.      Physical Exam Triage Vital Signs ED Triage Vitals  Enc Vitals Group     BP 11/22/21 1222 (!) 130/108     Pulse Rate 11/22/21 1222 78     Resp 11/22/21 1222 14     Temp 11/22/21 1222 97.6 F (36.4 C)     Temp Source 11/22/21 1222 Oral     SpO2 11/22/21 1222 97 %     Weight 11/22/21 1220 185 lb (83.9 kg)     Height 11/22/21 1220 '5\' 6"'$  (1.676 m)     Head Circumference --      Peak Flow --      Pain Score 11/22/21 1220 8     Pain Loc --      Pain Edu? --      Excl. in [place]? --    No data found.  Updated Vital Signs BP (!) 130/108 (BP Location: Left Arm)   Pulse 78   Temp 97.6 F (36.4 C) (Oral)   Resp 14   Ht '5\' 6"'$  (1.676 m)   Wt 83.9 kg   SpO2 97%   BMI 29.86 kg/m   Visual Acuity Right Eye Distance:   Left Eye Distance:   Bilateral Distance:    Right Eye Near:   Left Eye Near:    Bilateral Near:     Physical Exam GEN:     alert, non-toxic appearing female in no distress     HENT:  mucus membranes moist, oropharyngeal without lesions or exudate, no tonsillar hypertrophy, mild oropharyngeal erythema, moderate erythematous edematous turbinates, clear nasal discharge, bilateral TM normal, maxillary and frontal sinus pressure EYES:   pupils equal and reactive, EOMi, no scleral injection NECK:  normal ROM, anterior cervical lymphadenopathy RESP:  no increased work of breathing, clear to auscultation bilaterally CVS:   regular rate and rhythm Skin:   warm and dry, no rash on visible skin , normal skin turgor    UC Treatments / Results  Labs (all labs ordered are listed, but only abnormal results are displayed) Labs Reviewed - No data to display  EKG   Radiology No results found.  Procedures Procedures (including critical care time)  Medications Ordered in UC Medications - No data to display  Initial Impression / Assessment and Plan / UC Course  I have reviewed the triage vital signs and the nursing  notes.  Pertinent labs & imaging results that were available during my care of the patient were reviewed by me and considered in my medical decision making (see chart for details).       Pt is a 51 y.o. female who presents for about 2 weeks of  nasal congestion and cough.  [name] is  afebrile here without recent antipyretics. Satting well on room air. Overall pt is non-toxic appearing, well hydrated, without respiratory distress. Pulmonary exam  is unremarkable.  COVID and influenza testing deferred due to symptom duration.  Treat presumed bacterial sinobronchitis with antibiotics as below. Typical duration of symptoms discussed. Return and ED precautions given and patient voiced understanding.  Discussed MDM, treatment plan and plan for follow-up with patient who agrees with plan.     Final Clinical Impressions(s) / UC Diagnoses   Final diagnoses:  Sinobronchitis     Discharge Instructions      Stop by the pharmacy to pick up your prescriptions.  Follow up with your primary care provider as needed.      ED Prescriptions     Medication Sig Dispense Auth. Provider   amoxicillin (AMOXIL) 875 MG tablet Take 1 tablet (875 mg total) by mouth 2 (two) times daily for 7 days. 14 tablet Samy Ryner, Ronnette Juniper, DO      PDMP not reviewed this encounter.   Lyndee Hensen, DO 11/22/21 1433

## 2021-11-22 NOTE — Discharge Instructions (Signed)
Stop by the pharmacy to pick up your prescriptions.  Follow up with your primary care provider as needed.  

## 2022-02-18 ENCOUNTER — Other Ambulatory Visit: Payer: Self-pay

## 2022-02-18 DIAGNOSIS — Z1231 Encounter for screening mammogram for malignant neoplasm of breast: Secondary | ICD-10-CM

## 2022-04-20 ENCOUNTER — Ambulatory Visit
Admission: EM | Admit: 2022-04-20 | Discharge: 2022-04-20 | Disposition: A | Discharge status: Home / Self Care | Payer: BC Managed Care – PPO | Attending: Emergency Medicine | Admitting: Emergency Medicine

## 2022-04-20 DIAGNOSIS — F32A Depression, unspecified: Secondary | ICD-10-CM | POA: Insufficient documentation

## 2022-04-20 DIAGNOSIS — Z1152 Encounter for screening for COVID-19: Secondary | ICD-10-CM | POA: Insufficient documentation

## 2022-04-20 DIAGNOSIS — F419 Anxiety disorder, unspecified: Secondary | ICD-10-CM | POA: Diagnosis not present

## 2022-04-20 DIAGNOSIS — Z85828 Personal history of other malignant neoplasm of skin: Secondary | ICD-10-CM | POA: Insufficient documentation

## 2022-04-20 DIAGNOSIS — F17210 Nicotine dependence, cigarettes, uncomplicated: Secondary | ICD-10-CM | POA: Insufficient documentation

## 2022-04-20 DIAGNOSIS — J069 Acute upper respiratory infection, unspecified: Secondary | ICD-10-CM | POA: Insufficient documentation

## 2022-04-20 LAB — SARS CORONAVIRUS 2 BY RT PCR: SARS Coronavirus 2 by RT PCR: NEGATIVE

## 2022-04-20 MED ORDER — IPRATROPIUM BROMIDE 0.06 % NA SOLN: 2.0000 | Freq: Four times a day (QID) | NASAL | 12 refills | Status: DC

## 2022-04-20 NOTE — Discharge Instructions (Signed)
Your test for COVID today was negative.  I do believe you have a viral respiratory infection.  Use the Atrovent nasal spray, 2 squirts up each nostril to 6 hours, as needed for nasal congestion, runny nose, and sinus pressure.  You can also use sinus irrigation to help alleviate inflammation in your sinuses.  I recommend using a [employer] sinus irrigation kit and distilled water.  Do not use tap water.  If your symptoms continue, or they worsen, return for reevaluation or see your primary care provider.

## 2022-04-20 NOTE — ED Triage Notes (Signed)
Pt presents to UC c/o sinus issues. Pt states sxs onset Thursday, congestion & runny nose. Pt states her father in assisted living tested positive for covid on Friday and she visited him on Tuesday.

## 2022-04-20 NOTE — ED Provider Notes (Signed)
[facility]    CSN: [id number] Arrival date & time: 04/20/22  1354      History   Chief Complaint Chief Complaint  Patient presents with   Facial Pain    HPI Hannah Rodgers is a 52 y.o. female.   HPI  51 year old female with a past medical history significant for anxiety and depression and skin cancer presents for evaluation of 4 days worth of runny nose and nasal congestion.  Her father lives in assisted living and tested positive for COVID on Friday and she visited him the Tuesday before.  Past Medical History:  Diagnosis Date   Anxiety    Depression    Skin cancer 2020   nose, arms    There are no problems to display for this patient.   Past Surgical History:  Procedure Laterality Date   ABDOMINAL HYSTERECTOMY     CESAREAN SECTION     x2   CYSTOSCOPY N/A 07/04/2018   Procedure: CYSTOSCOPY;  Surgeon: Benjaman Kindler, MD;  Location: [facility];  Service: Gynecology;  Laterality: N/A;   LAPAROSCOPIC BILATERAL SALPINGECTOMY Bilateral 07/04/2018   Procedure: LAPAROSCOPIC BILATERAL SALPINGECTOMY;  Surgeon: Benjaman Kindler, MD;  Location: [facility];  Service: Gynecology;  Laterality: Bilateral;   LAPAROSCOPIC HYSTERECTOMY N/A 07/04/2018   Procedure: HYSTERECTOMY TOTAL LAPAROSCOPIC;  Surgeon: Benjaman Kindler, MD;  Location: [facility];  Service: Gynecology;  Laterality: N/A;   TONSILLECTOMY      OB History   No obstetric history on file.      Home Medications    Prior to Admission medications   Medication Sig Start Date End Date Taking? Authorizing Provider  buPROPion (WELLBUTRIN XL) 150 MG 24 hr tablet Take by mouth. 02/17/22 02/17/23 Yes [provider]  ipratropium (ATROVENT) 0.06 % nasal spray Place 2 sprays into both nostrils 4 (four) times daily. 04/20/22  Yes Margarette Canada, NP  sertraline (ZOLOFT) 100 MG tablet Take 150 mg by mouth at bedtime.    Yes [provider]  acetaminophen (TYLENOL) 500 MG tablet TAKE 2 TABLETS (1,000 MG  TOTAL) BY MOUTH EVERY 6 (SIX) HOURS FOR 3 DAYS. 07/04/18   [provider]  albuterol (VENTOLIN HFA) 108 (90 Base) MCG/ACT inhaler Inhale 1-2 puffs into the lungs every 4 (four) hours as needed for wheezing or shortness of breath. 03/30/21   Melynda Ripple, MD  fluticasone (FLONASE) 50 MCG/ACT nasal spray Place 2 sprays into both nostrils daily. 03/30/21   Melynda Ripple, MD  ibuprofen (ADVIL) 600 MG tablet Take 1 tablet (600 mg total) by mouth every 6 (six) hours as needed. 03/30/21   Melynda Ripple, MD  montelukast (SINGULAIR) 10 MG tablet Take 10 mg by mouth at bedtime.    [provider]  Nutritional Supplements (ESTROVEN PO) Take 1 tablet by mouth daily.    [provider]  Spacer/Aero-Holding Chambers (AEROCHAMBER PLUS) inhaler Use with inhaler 03/30/21   Melynda Ripple, MD  fexofenadine (ALLEGRA) 180 MG tablet Take by mouth.  10/08/19  [provider]  gabapentin (NEURONTIN) 800 MG tablet Take 1 tablet (800 mg total) by mouth at bedtime for 14 days. Take nightly for 3 days, then up to 14 days as needed 07/04/18 07/08/19  Benjaman Kindler, MD    Family History Family History  Problem Relation Age of Onset   Hypertension Mother    Hypertension Father    Stroke Father    Breast cancer Neg Hx     Social History Social History   Tobacco Use  Smoking status: Every Day    Packs/day: .25    Types: Cigarettes   Smokeless tobacco: Never  Vaping Use   Vaping Use: Never used  Substance Use Topics   Alcohol use: No   Drug use: No     Allergies   Patient has no known allergies.   Review of Systems Review of Systems  Constitutional:  Negative for fever.  HENT:  Positive for congestion, rhinorrhea and sore throat. Negative for ear pain.   Respiratory:  Negative for cough.      Physical Exam Triage Vital Signs ED Triage Vitals  Enc Vitals Group     BP 04/20/22 1436 (!) 137/93     Pulse Rate 04/20/22 1436 81     Resp --      Temp  04/20/22 1436 97.9 F (36.6 C)     Temp Source 04/20/22 1436 Oral     SpO2 04/20/22 1436 96 %     Weight 04/20/22 1435 200 lb (90.7 kg)     Height 04/20/22 1435 5\' 6"  (1.676 m)     Head Circumference --      Peak Flow --      Pain Score 04/20/22 1435 0     Pain Loc --      Pain Edu? --      Excl. in [place]? --    No data found.  Updated Vital Signs BP (!) 137/93 (BP Location: Left Arm)   Pulse 81   Temp 97.9 F (36.6 C) (Oral)   Ht 5\' 6"  (1.676 m)   Wt 200 lb (90.7 kg)   SpO2 96%   BMI 32.28 kg/m   Visual Acuity Right Eye Distance:   Left Eye Distance:   Bilateral Distance:    Right Eye Near:   Left Eye Near:    Bilateral Near:     Physical Exam Vitals and nursing note reviewed.  Constitutional:      Appearance: Normal appearance. She is not ill-appearing.  HENT:     Head: Normocephalic and atraumatic.     Right Ear: Tympanic membrane, ear canal and external ear normal. There is no impacted cerumen.     Left Ear: Tympanic membrane, ear canal and external ear normal. There is no impacted cerumen.     Nose: Congestion and rhinorrhea present.     Comments: Haze Rushing is erythematous and edematous with clear discharge in both nares.    Mouth/Throat:     Mouth: Mucous membranes are moist.     Pharynx: Oropharynx is clear. Posterior oropharyngeal erythema present. No oropharyngeal exudate.     Comments: Mild erythema to the posterior oropharynx with clear postnasal drip. Cardiovascular:     Rate and Rhythm: Normal rate and regular rhythm.     Pulses: Normal pulses.     Heart sounds: Normal heart sounds. No murmur heard.    No friction rub. No gallop.  Pulmonary:     Effort: Pulmonary effort is normal.     Breath sounds: Normal breath sounds. No wheezing, rhonchi or rales.  Musculoskeletal:     Cervical back: Normal range of motion and neck supple.  Lymphadenopathy:     Cervical: No cervical adenopathy.  Skin:    General: Skin is warm and dry.     Capillary Refill:  Capillary refill takes less than 2 seconds.     Findings: No rash.  Neurological:     Mental Status: She is alert.      UC Treatments / Results  Labs (all labs ordered are listed, but only abnormal results are displayed) Labs Reviewed  SARS CORONAVIRUS 2 BY RT PCR    EKG   Radiology No results found.  Procedures Procedures (including critical care time)  Medications Ordered in UC Medications - No data to display  Initial Impression / Assessment and Plan / UC Course  I have reviewed the triage vital signs and the nursing notes.  Pertinent labs & imaging results that were available during my care of the patient were reviewed by me and considered in my medical decision making (see chart for details).   Patient is a pleasant, nontoxic-appearing 52 year old female presenting for evaluation of runny nose and nasal congestion has been going on for the past 4 days in the setting of a possible COVID exposure.  She states that on the first day she had a sore throat but that has since resolved.  On exam she does have inflamed nasal mucosa with clear rhinorrhea.  Posterior oropharynx is also mildly erythematous with clear postnasal drip.  Tonsillar pillars are unremarkable.  No cervical lymphadenopathy on exam and cardiopulmonary exam reveals good lung sounds in all fields.  I have advised the patient that they change the guidelines for COVID and she will no longer need to quarantine if she does not have a fever.  She is requesting COVID testing.  COVID PCR has been ordered.  COVID PCR is negative.  I will discharge patient home with diagnosis of viral URI.  I will prescribe Atrovent nasal spray that she use to help with the congestion and runny nose.   Final Clinical Impressions(s) / UC Diagnoses   Final diagnoses:  Viral URI     Discharge Instructions      Your test for COVID today was negative.  I do believe you have a viral respiratory infection.  Use the Atrovent nasal  spray, 2 squirts up each nostril to 6 hours, as needed for nasal congestion, runny nose, and sinus pressure.  You can also use sinus irrigation to help alleviate inflammation in your sinuses.  I recommend using a [employer] sinus irrigation kit and distilled water.  Do not use tap water.  If your symptoms continue, or they worsen, return for reevaluation or see your primary care provider.     ED Prescriptions     Medication Sig Dispense Auth. Provider   ipratropium (ATROVENT) 0.06 % nasal spray Place 2 sprays into both nostrils 4 (four) times daily. 15 mL Margarette Canada, NP      PDMP not reviewed this encounter.   Margarette Canada, NP 04/20/22 [phone]

## 2022-06-01 ENCOUNTER — Ambulatory Visit
Admission: RE | Admit: 2022-06-01 | Discharge: 2022-06-01 | Disposition: A | Discharge status: Home / Self Care | Payer: BC Managed Care – PPO | Source: Ambulatory Visit | Attending: Family Medicine | Admitting: Family Medicine

## 2022-06-01 VITALS — BP 124/86 | HR 78 | Temp 98.2°F | Resp 16

## 2022-06-01 DIAGNOSIS — J209 Acute bronchitis, unspecified: Secondary | ICD-10-CM

## 2022-06-01 DIAGNOSIS — F17200 Nicotine dependence, unspecified, uncomplicated: Secondary | ICD-10-CM | POA: Diagnosis not present

## 2022-06-01 MED ORDER — BENZONATATE 100 MG PO CAPS: 100.0000 mg | ORAL_CAPSULE | Freq: Three times a day (TID) | ORAL | 0 refills | Status: DC

## 2022-06-01 MED ORDER — ALBUTEROL SULFATE HFA 108 (90 BASE) MCG/ACT IN AERS: 1.0000 | INHALATION_SPRAY | RESPIRATORY_TRACT | 0 refills | Status: DC | PRN

## 2022-06-01 MED ORDER — AZITHROMYCIN 250 MG PO TABS: ORAL_TABLET | ORAL | 0 refills | Status: DC

## 2022-06-01 MED ORDER — PROMETHAZINE-DM 6.25-15 MG/5ML PO SYRP: 5.0000 mL | ORAL_SOLUTION | Freq: Four times a day (QID) | ORAL | 0 refills | Status: DC | PRN

## 2022-06-01 MED ORDER — PREDNISONE 20 MG PO TABS: 40.0000 mg | ORAL_TABLET | Freq: Every day | ORAL | 0 refills | Status: AC

## 2022-06-01 NOTE — ED Provider Notes (Signed)
[facility]    CSN: [id number] Arrival date & time: 06/01/22  1430      History   Chief Complaint Chief Complaint  Hannah Rodgers presents with   Nasal Congestion    Coughing - Entered by Hannah Rodgers   Cough   Generalized Body Aches    HPI Hannah Rodgers is a 52 y.o. female.   HPI   Hannah Rodgers presents for productive cough, body aches, rhinorrhea and nasal congestion for the past week. She was not able to keep last night. Had chills but no fever.  She teaches kindergarten.    Fever : no Chills: yes Sore throat: yes Cough: yes Sputum: yes Chest tightness: no Shortness of breath: no Wheezing: no  Nasal congestion : yes Rhinorrhea: yes  Myalgias: no Appetite: normal  Hydration: normal  Abdominal pain: no Nausea: no Vomiting: no Diarrhea: No Rash: No Sleep disturbance: yes  Headache: yes     Past Medical History:  Diagnosis Date   Anxiety    Depression    Skin cancer 2020   nose, arms    There are no problems to display for this Hannah Rodgers.   Past Surgical History:  Procedure Laterality Date   ABDOMINAL HYSTERECTOMY     CESAREAN SECTION     x2   CYSTOSCOPY N/A 07/04/2018   Procedure: CYSTOSCOPY;  Surgeon: Christeen Douglas, MD;  Location: [facility];  Service: Gynecology;  Laterality: N/A;   LAPAROSCOPIC BILATERAL SALPINGECTOMY Bilateral 07/04/2018   Procedure: LAPAROSCOPIC BILATERAL SALPINGECTOMY;  Surgeon: Christeen Douglas, MD;  Location: [facility];  Service: Gynecology;  Laterality: Bilateral;   LAPAROSCOPIC HYSTERECTOMY N/A 07/04/2018   Procedure: HYSTERECTOMY TOTAL LAPAROSCOPIC;  Surgeon: Christeen Douglas, MD;  Location: [facility];  Service: Gynecology;  Laterality: N/A;   TONSILLECTOMY      OB History   No obstetric history on file.      Home Medications    Prior to Admission medications   Medication Sig Start Date End Date Taking? Authorizing Provider  azithromycin (ZITHROMAX Z-PAK) 250 MG tablet Take 2 tablets on the first day then 1  tablet daily 06/01/22  Yes Mahir Prabhakar, DO  benzonatate (TESSALON) 100 MG capsule Take 1 capsule (100 mg total) by mouth every 8 (eight) hours. 06/01/22  Yes Zakiyyah Savannah, DO  hydrOXYzine (ATARAX) 25 MG tablet TAKE 1 TABLET BY MOUTH EVERY DAY AT BEDTIME AS NEEDED FOR ANXIETY 03/17/22  Yes [provider]  predniSONE (DELTASONE) 20 MG tablet Take 2 tablets (40 mg total) by mouth daily for 5 days. 06/01/22 06/06/22 Yes Jaquelyne Firkus, DO  promethazine-dextromethorphan (PROMETHAZINE-DM) 6.25-15 MG/5ML syrup Take 5 mLs by mouth 4 (four) times daily as needed. 06/01/22  Yes Lillee Mooneyhan, DO  acetaminophen (TYLENOL) 500 MG tablet TAKE 2 TABLETS (1,000 MG TOTAL) BY MOUTH EVERY 6 (SIX) HOURS FOR 3 DAYS. 07/04/18   [provider]  albuterol (VENTOLIN HFA) 108 (90 Base) MCG/ACT inhaler Inhale 1-2 puffs into the lungs every 4 (four) hours as needed for wheezing or shortness of breath. 06/01/22   Abdallah Hern, Seward Meth, DO  buPROPion (WELLBUTRIN XL) 150 MG 24 hr tablet Take by mouth. 02/17/22 02/17/23  [provider]  fluticasone (FLONASE) 50 MCG/ACT nasal spray Place 2 sprays into both nostrils daily. 03/30/21   Domenick Gong, MD  ibuprofen (ADVIL) 600 MG tablet Take 1 tablet (600 mg total) by mouth every 6 (six) hours as needed. 03/30/21   Domenick Gong, MD  montelukast (SINGULAIR) 10 MG tablet Take 10 mg by mouth at bedtime.  [provider]  Nutritional Supplements (ESTROVEN PO) Take 1 tablet by mouth daily.    [provider]  sertraline (ZOLOFT) 100 MG tablet Take 150 mg by mouth at bedtime.     [provider]  Spacer/Aero-Holding Chambers (AEROCHAMBER PLUS) inhaler Use with inhaler 03/30/21   Domenick Gong, MD  fexofenadine (ALLEGRA) 180 MG tablet Take by mouth.  10/08/19  [provider]  gabapentin (NEURONTIN) 800 MG tablet Take 1 tablet (800 mg total) by mouth at bedtime for 14 days. Take nightly for 3 days, then up to 14 days as  needed 07/04/18 07/08/19  Christeen Douglas, MD    Family History Family History  Problem Relation Age of Onset   Hypertension Mother    Hypertension Father    Stroke Father    Breast cancer Neg Hx     Social History Social History   Tobacco Use   Smoking status: Every Day    Packs/day: .25    Types: Cigarettes   Smokeless tobacco: Never  Vaping Use   Vaping Use: Never used  Substance Use Topics   Alcohol use: No   Drug use: No     Allergies   Hannah Rodgers has no known allergies.   Review of Systems Review of Systems: negative unless otherwise stated in HPI.      Physical Exam Triage Vital Signs ED Triage Vitals  Enc Vitals Group     BP 06/01/22 1507 124/86     Pulse Rate 06/01/22 1507 78     Resp 06/01/22 1507 16     Temp 06/01/22 1507 98.2 F (36.8 C)     Temp Source 06/01/22 1507 Oral     SpO2 06/01/22 1507 100 %     Weight --      Height --      Head Circumference --      Peak Flow --      Pain Score 06/01/22 1506 0     Pain Loc --      Pain Edu? --      Excl. in GC? --    No data found.  Updated Vital Signs BP 124/86 (BP Location: Left Arm)   Pulse 78   Temp 98.2 F (36.8 C) (Oral)   Resp 16   SpO2 100%   Visual Acuity Right Eye Distance:   Left Eye Distance:   Bilateral Distance:    Right Eye Near:   Left Eye Near:    Bilateral Near:     Physical Exam GEN:     alert, non-toxic appearing female in no distress    HENT:  mucus membranes moist, oropharyngeal without lesions or erythema, no tonsillar hypertrophy or exudates,  moderate erythematous edematous turbinates, clear nasal discharge, bilateral maxillary sinus tenderness  EYES:   pupils equal and reactive, no scleral injection or discharge NECK:  normal ROM RESP:  no increased work of breathing, faint expiratory wheezing  CVS:   regular rate and rhythm Skin:   warm and dry, no rash on visible skin    UC Treatments / Results  Labs (all labs ordered are listed, but only abnormal  results are displayed) Labs Reviewed - No data to display  EKG   Radiology No results found.  Procedures Procedures (including critical care time)  Medications Ordered in UC Medications - No data to display  Initial Impression / Assessment and Plan / UC Course  I have reviewed the triage vital signs and the nursing notes.  Pertinent labs &  imaging results that were available during my care of the Hannah Rodgers were reviewed by me and considered in my medical decision making (see chart for details).       Pt is a 52 y.o. female with tobacco use and hx asthma who presents for  weeks of cough that is not improving.  Hannah Rodgers is afebrile here without recent antipyretics. Satting well on room air. Overall pt is  non-toxic appearing, well hydrated, without respiratory distress. Pulmonary exam is remarkable for faint expiratory wheezing and frequent productive cough.  After shared decision making, we will not pursue chest x-ray at this time as it currently would not change management.  COVID   testing deferred due to length of symptoms.   Treat acute bronchitis with steroids and antibiotics as below.  Tessalon perles and Promethazine DM cough syrup given for cough and allow Hannah Rodgers to rest.  Albuterol inhaler refilled. Typical duration of symptoms discussed. Return and ED precautions given and Hannah Rodgers voiced understanding.   Discussed MDM, treatment plan and plan for follow-up with Hannah Rodgers who agrees with plan.     Final Clinical Impressions(s) / UC Diagnoses   Final diagnoses:  Acute bronchitis, unspecified organism  Tobacco use disorder     Discharge Instructions      Stop by the pharmacy to pick up your prescriptions.  Follow up with your primary care provider as needed.      ED Prescriptions     Medication Sig Dispense Auth. Provider   albuterol (VENTOLIN HFA) 108 (90 Base) MCG/ACT inhaler Inhale 1-2 puffs into the lungs every 4 (four) hours as needed for wheezing or  shortness of breath. 1 each Orlo Brickle, DO   predniSONE (DELTASONE) 20 MG tablet Take 2 tablets (40 mg total) by mouth daily for 5 days. 10 tablet Lydiana Milley, DO   azithromycin (ZITHROMAX Z-PAK) 250 MG tablet Take 2 tablets on the first day then 1 tablet daily 6 tablet Terica Yogi, DO   promethazine-dextromethorphan (PROMETHAZINE-DM) 6.25-15 MG/5ML syrup Take 5 mLs by mouth 4 (four) times daily as needed. 118 mL Lumina Gitto, DO   benzonatate (TESSALON) 100 MG capsule Take 1 capsule (100 mg total) by mouth every 8 (eight) hours. 21 capsule Katha Cabal, DO      PDMP not reviewed this encounter.   Katha Cabal, DO 06/01/22 1614

## 2022-06-01 NOTE — ED Triage Notes (Signed)
Pt c/o congestion x 1 week and a productive cough and bodyaches that started yesterday.

## 2022-06-01 NOTE — Discharge Instructions (Addendum)
Stop by the pharmacy to pick up your prescriptions.  Follow up with your primary care provider as needed.  

## 2022-06-10 ENCOUNTER — Inpatient Hospital Stay: Admission: RE | Admit: 2022-06-10 | Payer: BC Managed Care – PPO | Source: Ambulatory Visit

## 2022-10-11 ENCOUNTER — Ambulatory Visit
Admission: RE | Admit: 2022-10-11 | Discharge: 2022-10-11 | Disposition: A | Discharge status: Home / Self Care | Payer: BC Managed Care – PPO | Source: Ambulatory Visit

## 2022-10-11 VITALS — BP 123/64 | HR 77 | Temp 98.0°F | Resp 16 | Ht 66.0 in | Wt 200.0 lb

## 2022-10-11 DIAGNOSIS — J0140 Acute pansinusitis, unspecified: Secondary | ICD-10-CM

## 2022-10-11 MED ORDER — IPRATROPIUM BROMIDE 0.06 % NA SOLN: 2.0000 | Freq: Four times a day (QID) | NASAL | 12 refills | Status: DC

## 2022-10-11 MED ORDER — AMOXICILLIN-POT CLAVULANATE 875-125 MG PO TABS: 1.0000 | ORAL_TABLET | Freq: Two times a day (BID) | ORAL | 0 refills | Status: AC

## 2022-10-11 MED ORDER — PROMETHAZINE-DM 6.25-15 MG/5ML PO SYRP: 5.0000 mL | ORAL_SOLUTION | Freq: Four times a day (QID) | ORAL | 0 refills | Status: DC | PRN

## 2022-10-11 MED ORDER — BENZONATATE 100 MG PO CAPS: 200.0000 mg | ORAL_CAPSULE | Freq: Three times a day (TID) | ORAL | 0 refills | Status: DC

## 2022-10-11 NOTE — ED Triage Notes (Addendum)
Pt c/o nasal congestion, sinus pain/pressure, cough. Started about 4 days ago. Denies fever. Pt states she gets sinus infections in the fall and spring. Declines covid testing.

## 2022-10-11 NOTE — Discharge Instructions (Addendum)
The Augmentin twice daily with food for 10 days for treatment of your sinusitis.  Perform sinus irrigation 2-3 times a day with a NeilMed sinus rinse kit and distilled water.  Do not use tap water.  You can use plain over-the-counter Mucinex every 6 hours to break up the stickiness of the mucus so your body can clear it.  Increase your oral fluid intake to thin out your mucus so that is also able for your body to clear more easily.  Take an over-the-counter probiotic, such as Culturelle-align-activia, 1 hour after each dose of antibiotic to prevent diarrhea.  Use the Atrovent nasal spray, 2 squirts in each nostril every 6 hours, as needed for runny nose and postnasal drip.  Use the Tessalon Perles every 8 hours during the day.  Take them with a small sip of water.  They may give you some numbness to the base of your tongue or a metallic taste in your mouth, this is normal.  Use the Promethazine DM cough syrup at bedtime for cough and congestion.  It will make you drowsy so do not take it during the day.  Return for reevaluation or see your primary care provider for any new or worsening symptoms.

## 2022-10-11 NOTE — ED Provider Notes (Signed)
[facility]    CSN: [id number] Arrival date & time: 10/11/22  1106      History   Chief Complaint Chief Complaint  Patient presents with   Nasal Congestion    HPI Hannah Rodgers is a 52 y.o. female.   HPI  52 year old female with a past medical history significant for skin cancer, depression, and anxiety presents for evaluation of a weeks worth of nasal congestion, sinus pressure with green nasal discharge, and ear pain, sore throat, and cough that is intermittently productive for green sputum.  No shortness of breath or wheezing.  No fever.  Past Medical History:  Diagnosis Date   Anxiety    Depression    Skin cancer 2020   nose, arms    There are no problems to display for this patient.   Past Surgical History:  Procedure Laterality Date   ABDOMINAL HYSTERECTOMY     CESAREAN SECTION     x2   CYSTOSCOPY N/A 07/04/2018   Procedure: CYSTOSCOPY;  Surgeon: Christeen Douglas, MD;  Location: [facility];  Service: Gynecology;  Laterality: N/A;   LAPAROSCOPIC BILATERAL SALPINGECTOMY Bilateral 07/04/2018   Procedure: LAPAROSCOPIC BILATERAL SALPINGECTOMY;  Surgeon: Christeen Douglas, MD;  Location: [facility];  Service: Gynecology;  Laterality: Bilateral;   LAPAROSCOPIC HYSTERECTOMY N/A 07/04/2018   Procedure: HYSTERECTOMY TOTAL LAPAROSCOPIC;  Surgeon: Christeen Douglas, MD;  Location: [facility];  Service: Gynecology;  Laterality: N/A;   TONSILLECTOMY      OB History   No obstetric history on file.      Home Medications    Prior to Admission medications   Medication Sig Start Date End Date Taking? Authorizing Provider  amoxicillin-clavulanate (AUGMENTIN) 875-125 MG tablet Take 1 tablet by mouth every 12 (twelve) hours for 10 days. 10/11/22 10/21/22 Yes Becky Augusta, NP  benzonatate (TESSALON) 100 MG capsule Take 2 capsules (200 mg total) by mouth every 8 (eight) hours. 10/11/22  Yes Becky Augusta, NP  buPROPion (WELLBUTRIN XL) 150 MG 24 hr tablet Take by mouth.  02/17/22 02/17/23 Yes [provider]  cetirizine (ZYRTEC) 10 MG tablet Take 10 mg by mouth daily.   Yes [provider]  hydrOXYzine (ATARAX) 25 MG tablet TAKE 1 TABLET BY MOUTH EVERY DAY AT BEDTIME AS NEEDED FOR ANXIETY 03/17/22  Yes [provider]  ipratropium (ATROVENT) 0.06 % nasal spray Place 2 sprays into both nostrils 4 (four) times daily. 10/11/22  Yes Becky Augusta, NP  promethazine-dextromethorphan (PROMETHAZINE-DM) 6.25-15 MG/5ML syrup Take 5 mLs by mouth 4 (four) times daily as needed. 10/11/22  Yes Becky Augusta, NP  sertraline (ZOLOFT) 100 MG tablet Take 150 mg by mouth at bedtime.    Yes [provider]  acetaminophen (TYLENOL) 500 MG tablet TAKE 2 TABLETS (1,000 MG TOTAL) BY MOUTH EVERY 6 (SIX) HOURS FOR 3 DAYS. 07/04/18   [provider]  albuterol (VENTOLIN HFA) 108 (90 Base) MCG/ACT inhaler Inhale 1-2 puffs into the lungs every 4 (four) hours as needed for wheezing or shortness of breath. 06/01/22   Brimage, Seward Meth, DO  fluticasone (FLONASE) 50 MCG/ACT nasal spray Place 2 sprays into both nostrils daily. 03/30/21   Domenick Gong, MD  ibuprofen (ADVIL) 600 MG tablet Take 1 tablet (600 mg total) by mouth every 6 (six) hours as needed. 03/30/21   Domenick Gong, MD  montelukast (SINGULAIR) 10 MG tablet Take 10 mg by mouth at bedtime.    [provider]  Nutritional Supplements (ESTROVEN PO) Take 1 tablet by mouth daily.  [provider]  Spacer/Aero-Holding Chambers (AEROCHAMBER PLUS) inhaler Use with inhaler 03/30/21   Domenick Gong, MD  fexofenadine (ALLEGRA) 180 MG tablet Take by mouth.  10/08/19  [provider]  gabapentin (NEURONTIN) 800 MG tablet Take 1 tablet (800 mg total) by mouth at bedtime for 14 days. Take nightly for 3 days, then up to 14 days as needed 07/04/18 07/08/19  Christeen Douglas, MD    Family History Family History  Problem Relation Age of Onset   Hypertension Mother    Hypertension  Father    Stroke Father    Breast cancer Neg Hx     Social History Social History   Tobacco Use   Smoking status: Every Day    Current packs/day: 0.25    Types: Cigarettes   Smokeless tobacco: Never  Vaping Use   Vaping status: Never Used  Substance Use Topics   Alcohol use: No   Drug use: No     Allergies   Patient has no known allergies.   Review of Systems Review of Systems  Constitutional:  Negative for fever.  HENT:  Positive for congestion, ear pain, rhinorrhea, sinus pressure and sore throat.   Respiratory:  Positive for cough. Negative for shortness of breath and wheezing.      Physical Exam Triage Vital Signs ED Triage Vitals  Encounter Vitals Group     BP      Systolic BP Percentile      Diastolic BP Percentile      Pulse      Resp      Temp      Temp src      SpO2      Weight      Height      Head Circumference      Peak Flow      Pain Score      Pain Loc      Pain Education      Exclude from Growth Chart    No data found.  Updated Vital Signs BP 123/64 (BP Location: Right Arm)   Pulse 77   Temp 98 F (36.7 C) (Oral)   Resp 16   Ht 5\' 6"  (1.676 m)   Wt 199 lb 15.3 oz (90.7 kg)   SpO2 98%   BMI 32.27 kg/m   Visual Acuity Right Eye Distance:   Left Eye Distance:   Bilateral Distance:    Right Eye Near:   Left Eye Near:    Bilateral Near:     Physical Exam Vitals and nursing note reviewed.  Constitutional:      Appearance: Normal appearance. She is ill-appearing.  HENT:     Head: Normocephalic and atraumatic.     Right Ear: Tympanic membrane, ear canal and external ear normal. There is no impacted cerumen.     Left Ear: Tympanic membrane, ear canal and external ear normal. There is no impacted cerumen.     Nose: Congestion and rhinorrhea present.     Comments: Nasal mucosa is erythematous and markedly edematous with purulent discharge in both nares.  Patient has marked tenderness to bilateral frontal and maxillary sinuses  to compression.    Mouth/Throat:     Mouth: Mucous membranes are moist.     Pharynx: Oropharynx is clear. Posterior oropharyngeal erythema present. No oropharyngeal exudate.     Comments: Mild erythema to the posterior oropharynx with green postnasal drip. Cardiovascular:     Rate and Rhythm: Normal rate and regular rhythm.  Pulses: Normal pulses.     Heart sounds: Normal heart sounds. No murmur heard.    No friction rub. No gallop.  Pulmonary:     Effort: Pulmonary effort is normal.     Breath sounds: Normal breath sounds. No wheezing, rhonchi or rales.  Musculoskeletal:     Cervical back: Normal range of motion and neck supple.  Lymphadenopathy:     Cervical: No cervical adenopathy.  Skin:    General: Skin is warm and dry.     Capillary Refill: Capillary refill takes less than 2 seconds.     Findings: No rash.  Neurological:     General: No focal deficit present.     Mental Status: She is alert and oriented to person, place, and time.      UC Treatments / Results  Labs (all labs ordered are listed, but only abnormal results are displayed) Labs Reviewed - No data to display  EKG   Radiology No results found.  Procedures Procedures (including critical care time)  Medications Ordered in UC Medications - No data to display  Initial Impression / Assessment and Plan / UC Course  I have reviewed the triage vital signs and the nursing notes.  Pertinent labs & imaging results that were available during my care of the patient were reviewed by me and considered in my medical decision making (see chart for details).   Patient is a pleasant, though mildly ill-appearing, 52 year old female presenting for evaluation of weeks worth of sinus pain and pressure with purulent discharge and postnasal drip.  On exam she does have marked tenderness to bilateral maxillary and frontal sinuses as well as inflamed nasal mucosa and purulent discharge in both nares.  Cardiopulmonary exam is  benign.  I will treat patient for pansinusitis with Augmentin 875 twice daily for 10 days.  We discussed sinus irrigation.  I will also send over Atrovent nasal spray to help with her congestion and postnasal drip along with Tessalon Perles and Promethazine DM cough syrup for cough and congestion.  Return precautions reviewed.   Final Clinical Impressions(s) / UC Diagnoses   Final diagnoses:  Acute non-recurrent pansinusitis     Discharge Instructions      The Augmentin twice daily with food for 10 days for treatment of your sinusitis.  Perform sinus irrigation 2-3 times a day with a NeilMed sinus rinse kit and distilled water.  Do not use tap water.  You can use plain over-the-counter Mucinex every 6 hours to break up the stickiness of the mucus so your body can clear it.  Increase your oral fluid intake to thin out your mucus so that is also able for your body to clear more easily.  Take an over-the-counter probiotic, such as Culturelle-align-activia, 1 hour after each dose of antibiotic to prevent diarrhea.  Use the Atrovent nasal spray, 2 squirts in each nostril every 6 hours, as needed for runny nose and postnasal drip.  Use the Tessalon Perles every 8 hours during the day.  Take them with a small sip of water.  They may give you some numbness to the base of your tongue or a metallic taste in your mouth, this is normal.  Use the Promethazine DM cough syrup at bedtime for cough and congestion.  It will make you drowsy so do not take it during the day.  Return for reevaluation or see your primary care provider for any new or worsening symptoms.      ED Prescriptions  Medication Sig Dispense Auth. Provider   amoxicillin-clavulanate (AUGMENTIN) 875-125 MG tablet Take 1 tablet by mouth every 12 (twelve) hours for 10 days. 20 tablet Becky Augusta, NP   benzonatate (TESSALON) 100 MG capsule Take 2 capsules (200 mg total) by mouth every 8 (eight) hours. 21 capsule Becky Augusta,  NP   ipratropium (ATROVENT) 0.06 % nasal spray Place 2 sprays into both nostrils 4 (four) times daily. 15 mL Becky Augusta, NP   promethazine-dextromethorphan (PROMETHAZINE-DM) 6.25-15 MG/5ML syrup Take 5 mLs by mouth 4 (four) times daily as needed. 118 mL Becky Augusta, NP      PDMP not reviewed this encounter.   Becky Augusta, NP 10/11/22 1146

## 2022-12-04 ENCOUNTER — Ambulatory Visit
Admission: RE | Admit: 2022-12-04 | Discharge: 2022-12-04 | Disposition: A | Discharge status: Home / Self Care | Payer: BC Managed Care – PPO | Source: Ambulatory Visit | Attending: Family Medicine | Admitting: Family Medicine

## 2022-12-04 VITALS — BP 135/82 | HR 73 | Temp 97.7°F | Resp 15 | Ht 66.0 in | Wt 200.0 lb

## 2022-12-04 DIAGNOSIS — R051 Acute cough: Secondary | ICD-10-CM | POA: Diagnosis not present

## 2022-12-04 DIAGNOSIS — J019 Acute sinusitis, unspecified: Secondary | ICD-10-CM

## 2022-12-04 MED ORDER — AZITHROMYCIN 250 MG PO TABS: ORAL_TABLET | ORAL | 0 refills | Status: DC

## 2022-12-04 MED ORDER — HYDROCOD POLI-CHLORPHE POLI ER 10-8 MG/5ML PO SUER: 5.0000 mL | Freq: Two times a day (BID) | ORAL | 0 refills | Status: DC | PRN

## 2022-12-04 MED ORDER — PREDNISONE 10 MG (21) PO TBPK
ORAL_TABLET | Freq: Every day | ORAL | 0 refills | Status: DC
Start: 2022-12-04 — End: 2023-02-14

## 2022-12-04 NOTE — ED Triage Notes (Signed)
Patient c/o sinus congestion, nasal congestion, cough and chest congestion that started 2 weeks ago.  Patient reports discomfort when she coughs.  Patient denies fevers.

## 2022-12-04 NOTE — Discharge Instructions (Signed)
Stop by the pharmacy to pick up your prescriptions.  Follow up with your primary care provider as needed.  

## 2022-12-04 NOTE — ED Provider Notes (Signed)
[facility]    CSN: [id number] Arrival date & time: 12/04/22  1514      History   Chief Complaint Chief Complaint  Patient presents with   Cough    Appointment    HPI Hannah Rodgers is a 52 y.o. female.   HPI  History obtained from the patient. Kristianne presents for sinus pressure and nasal congestion for the past 2 weeks. Has been using the Netti-pot. Has started coughing up thick green stuff 3 days ago. She used her inhaler yesterday for chest tightness which helped.  No fever, vomiting, diarrhea or abdominal pain. She is tired and has back pain. She is a Midwife.   No history of asthma or COPD. She smoked for the past 20 years and smokes less than a pack a day.      Past Medical History:  Diagnosis Date   Anxiety    Depression    Skin cancer 2020   nose, arms    There are no problems to display for this patient.   Past Surgical History:  Procedure Laterality Date   ABDOMINAL HYSTERECTOMY     CESAREAN SECTION     x2   CYSTOSCOPY N/A 07/04/2018   Procedure: CYSTOSCOPY;  Surgeon: Christeen Douglas, MD;  Location: [facility];  Service: Gynecology;  Laterality: N/A;   LAPAROSCOPIC BILATERAL SALPINGECTOMY Bilateral 07/04/2018   Procedure: LAPAROSCOPIC BILATERAL SALPINGECTOMY;  Surgeon: Christeen Douglas, MD;  Location: [facility];  Service: Gynecology;  Laterality: Bilateral;   LAPAROSCOPIC HYSTERECTOMY N/A 07/04/2018   Procedure: HYSTERECTOMY TOTAL LAPAROSCOPIC;  Surgeon: Christeen Douglas, MD;  Location: [facility];  Service: Gynecology;  Laterality: N/A;   TONSILLECTOMY      OB History   No obstetric history on file.      Home Medications    Prior to Admission medications   Medication Sig Start Date End Date Taking? Authorizing Provider  azithromycin (ZITHROMAX Z-PAK) 250 MG tablet Take 2 tablets on day 1 then 1 tablet daily 12/04/22  Yes Virgina Deakins, DO  chlorpheniramine-HYDROcodone (TUSSIONEX) 10-8 MG/5ML Take 5 mLs by mouth every  12 (twelve) hours as needed. 12/04/22  Yes Mccartney Chuba, DO  predniSONE (STERAPRED UNI-PAK 21 TAB) 10 MG (21) TBPK tablet Take by mouth daily. Take 6 tabs by mouth daily for 1, then 5 tabs for 1 day, then 4 tabs for 1 day, then 3 tabs for 1 day, then 2 tabs for 1 day, then 1 tab for 1 day. 12/04/22  Yes Tatayana Beshears, DO  acetaminophen (TYLENOL) 500 MG tablet TAKE 2 TABLETS (1,000 MG TOTAL) BY MOUTH EVERY 6 (SIX) HOURS FOR 3 DAYS. 07/04/18   [provider]  albuterol (VENTOLIN HFA) 108 (90 Base) MCG/ACT inhaler Inhale 1-2 puffs into the lungs every 4 (four) hours as needed for wheezing or shortness of breath. 06/01/22   Meghen Akopyan, Seward Meth, DO  benzonatate (TESSALON) 100 MG capsule Take 2 capsules (200 mg total) by mouth every 8 (eight) hours. 10/11/22   Becky Augusta, NP  buPROPion (WELLBUTRIN XL) 150 MG 24 hr tablet Take by mouth. 02/17/22 02/17/23  [provider]  cetirizine (ZYRTEC) 10 MG tablet Take 10 mg by mouth daily.    [provider]  fluticasone (FLONASE) 50 MCG/ACT nasal spray Place 2 sprays into both nostrils daily. 03/30/21   Domenick Gong, MD  hydrOXYzine (ATARAX) 25 MG tablet TAKE 1 TABLET BY MOUTH EVERY DAY AT BEDTIME AS NEEDED FOR ANXIETY 03/17/22   [provider]  ibuprofen (ADVIL) 600 MG  tablet Take 1 tablet (600 mg total) by mouth every 6 (six) hours as needed. 03/30/21   Domenick Gong, MD  ipratropium (ATROVENT) 0.06 % nasal spray Place 2 sprays into both nostrils 4 (four) times daily. 10/11/22   Becky Augusta, NP  montelukast (SINGULAIR) 10 MG tablet Take 10 mg by mouth at bedtime.    [provider]  Nutritional Supplements (ESTROVEN PO) Take 1 tablet by mouth daily.    [provider]  sertraline (ZOLOFT) 100 MG tablet Take 150 mg by mouth at bedtime.     [provider]  Spacer/Aero-Holding Chambers (AEROCHAMBER PLUS) inhaler Use with inhaler 03/30/21   Domenick Gong, MD  fexofenadine (ALLEGRA) 180 MG tablet  Take by mouth.  10/08/19  [provider]  gabapentin (NEURONTIN) 800 MG tablet Take 1 tablet (800 mg total) by mouth at bedtime for 14 days. Take nightly for 3 days, then up to 14 days as needed 07/04/18 07/08/19  Christeen Douglas, MD    Family History Family History  Problem Relation Age of Onset   Hypertension Mother    Hypertension Father    Stroke Father    Breast cancer Neg Hx     Social History Social History   Tobacco Use   Smoking status: Every Day    Current packs/day: 0.25    Types: Cigarettes   Smokeless tobacco: Never  Vaping Use   Vaping status: Never Used  Substance Use Topics   Alcohol use: No   Drug use: No     Allergies   Patient has no known allergies.   Review of Systems Review of Systems: negative unless otherwise stated in HPI.      Physical Exam Triage Vital Signs ED Triage Vitals  Encounter Vitals Group     BP 12/04/22 1529 135/82     Systolic BP Percentile --      Diastolic BP Percentile --      Pulse Rate 12/04/22 1529 73     Resp 12/04/22 1529 15     Temp 12/04/22 1529 97.7 F (36.5 C)     Temp Source 12/04/22 1529 Oral     SpO2 12/04/22 1529 96 %     Weight 12/04/22 1528 199 lb 15.3 oz (90.7 kg)     Height 12/04/22 1528 5\' 6"  (1.676 m)     Head Circumference --      Peak Flow --      Pain Score 12/04/22 1528 6     Pain Loc --      Pain Education --      Exclude from Growth Chart --    No data found.  Updated Vital Signs BP 135/82 (BP Location: Left Arm)   Pulse 73   Temp 97.7 F (36.5 C) (Oral)   Resp 15   Ht 5\' 6"  (1.676 m)   Wt 90.7 kg   SpO2 96%   BMI 32.27 kg/m   Visual Acuity Right Eye Distance:   Left Eye Distance:   Bilateral Distance:    Right Eye Near:   Left Eye Near:    Bilateral Near:     Physical Exam GEN:     alert, non-toxic appearing female in no distress    HENT:  mucus membranes moist, oropharyngeal without lesions or erythema, no tonsillar hypertrophy or exudates,  moderate  erythematous edematous turbinates, thick yellow nasal discharge, maxillary sinus tenderness but no frontal sinus tenderness EYES:   pupils equal and reactive, no scleral injection or discharge NECK:  normal ROM, no meningismus   RESP:  no increased work of breathing, clear to auscultation bilaterally CVS:   regular rate and rhythm Skin:   warm and dry    UC Treatments / Results  Labs (all labs ordered are listed, but only abnormal results are displayed) Labs Reviewed - No data to display  EKG   Radiology No results found.  Procedures Procedures (including critical care time)  Medications Ordered in UC Medications - No data to display  Initial Impression / Assessment and Plan / UC Course  I have reviewed the triage vital signs and the nursing notes.  Pertinent labs & imaging results that were available during my care of the patient were reviewed by me and considered in my medical decision making (see chart for details).       Pt is a 52 y.o. female who presents for ongoing sinus pressure with congestion and 3 days of cough. Timmia is afebrile here without recent antipyretics. Satting well on room air. Overall pt is non-toxic appearing, well hydrated, without respiratory distress. Pulmonary exam is unremarkable. Suspect viral respiratory illness however has been having 2 weeks of sinus congestion with sinus pressure which is concerning for sinusitis. Discussed symptomatic treatment. Treat suspected sinusitis with antibiotics and steroids as below.  Tussionex for cough.  Typical duration of symptoms discussed.   Return and ED precautions given and voiced understanding. Discussed MDM, treatment plan and plan for follow-up with patient who agrees with plan.     Final Clinical Impressions(s) / UC Diagnoses   Final diagnoses:  None     Discharge Instructions      Stop by the pharmacy to pick up your prescriptions.  Follow up with your primary care provider as  needed.      ED Prescriptions     Medication Sig Dispense Auth. Provider   azithromycin (ZITHROMAX Z-PAK) 250 MG tablet Take 2 tablets on day 1 then 1 tablet daily 6 tablet Rosi Secrist, DO   predniSONE (STERAPRED UNI-PAK 21 TAB) 10 MG (21) TBPK tablet Take by mouth daily. Take 6 tabs by mouth daily for 1, then 5 tabs for 1 day, then 4 tabs for 1 day, then 3 tabs for 1 day, then 2 tabs for 1 day, then 1 tab for 1 day. 21 tablet Dohn Stclair, DO   chlorpheniramine-HYDROcodone (TUSSIONEX) 10-8 MG/5ML Take 5 mLs by mouth every 12 (twelve) hours as needed. 115 mL Isobel Eisenhuth, Seward Meth, DO      I have reviewed the PDMP during this encounter.   Katha Cabal, DO 12/04/22 1649

## 2023-02-14 ENCOUNTER — Encounter: Payer: Self-pay | Admitting: Emergency Medicine

## 2023-02-14 ENCOUNTER — Ambulatory Visit
Admission: EM | Admit: 2023-02-14 | Discharge: 2023-02-14 | Disposition: A | Discharge status: Home / Self Care | Payer: 59 | Attending: Emergency Medicine | Admitting: Emergency Medicine

## 2023-02-14 DIAGNOSIS — J069 Acute upper respiratory infection, unspecified: Secondary | ICD-10-CM

## 2023-02-14 LAB — RESP PANEL BY RT-PCR (FLU A&B, COVID) ARPGX2
Influenza A by PCR: NEGATIVE
Influenza B by PCR: NEGATIVE
SARS Coronavirus 2 by RT PCR: NEGATIVE

## 2023-02-14 LAB — GROUP A STREP BY PCR: Group A Strep by PCR: NOT DETECTED

## 2023-02-14 MED ORDER — BENZONATATE 100 MG PO CAPS: 200.0000 mg | ORAL_CAPSULE | Freq: Three times a day (TID) | ORAL | 0 refills | Status: DC

## 2023-02-14 MED ORDER — PROMETHAZINE-DM 6.25-15 MG/5ML PO SYRP: 5.0000 mL | ORAL_SOLUTION | Freq: Four times a day (QID) | ORAL | 0 refills | Status: DC | PRN

## 2023-02-14 MED ORDER — ALBUTEROL SULFATE HFA 108 (90 BASE) MCG/ACT IN AERS: 1.0000 | INHALATION_SPRAY | RESPIRATORY_TRACT | 0 refills | Status: DC | PRN

## 2023-02-14 MED ORDER — IPRATROPIUM BROMIDE 0.06 % NA SOLN: 2.0000 | Freq: Four times a day (QID) | NASAL | 12 refills | Status: DC

## 2023-02-14 NOTE — ED Provider Notes (Signed)
[facility]    CSN: [id number] Arrival date & time: 02/14/23  1008      History   Chief Complaint Chief Complaint  Patient presents with   Cough   Headache    HPI Hannah Rodgers is a 53 y.o. female.   HPI  53 year old female with past medical history of skin cancer, depression, and anxiety presents for evaluation of respiratory symptoms that began 4 days ago and consist of fatigue, nasal congestion, sore throat, headache and bodyaches, nonproductive cough, and shortness of breath.  She denies fever, nasal discharge, or wheezing.  Past Medical History:  Diagnosis Date   Anxiety    Depression    Skin cancer 2020   nose, arms    There are no active problems to display for this patient.   Past Surgical History:  Procedure Laterality Date   ABDOMINAL HYSTERECTOMY     CESAREAN SECTION     x2   CYSTOSCOPY N/A 07/04/2018   Procedure: CYSTOSCOPY;  Surgeon: Christeen Douglas, MD;  Location: [facility];  Service: Gynecology;  Laterality: N/A;   LAPAROSCOPIC BILATERAL SALPINGECTOMY Bilateral 07/04/2018   Procedure: LAPAROSCOPIC BILATERAL SALPINGECTOMY;  Surgeon: Christeen Douglas, MD;  Location: [facility];  Service: Gynecology;  Laterality: Bilateral;   LAPAROSCOPIC HYSTERECTOMY N/A 07/04/2018   Procedure: HYSTERECTOMY TOTAL LAPAROSCOPIC;  Surgeon: Christeen Douglas, MD;  Location: [facility];  Service: Gynecology;  Laterality: N/A;   TONSILLECTOMY      OB History   No obstetric history on file.      Home Medications    Prior to Admission medications   Medication Sig Start Date End Date Taking? Authorizing Provider  benzonatate (TESSALON) 100 MG capsule Take 2 capsules (200 mg total) by mouth every 8 (eight) hours. 02/14/23  Yes Becky Augusta, NP  ipratropium (ATROVENT) 0.06 % nasal spray Place 2 sprays into both nostrils 4 (four) times daily. 02/14/23  Yes Becky Augusta, NP  promethazine-dextromethorphan (PROMETHAZINE-DM) 6.25-15 MG/5ML syrup Take 5 mLs by mouth 4  (four) times daily as needed. 02/14/23  Yes Becky Augusta, NP  sertraline (ZOLOFT) 100 MG tablet Take 150 mg by mouth at bedtime.    Yes [provider]  acetaminophen (TYLENOL) 500 MG tablet TAKE 2 TABLETS (1,000 MG TOTAL) BY MOUTH EVERY 6 (SIX) HOURS FOR 3 DAYS. 07/04/18   [provider]  albuterol (VENTOLIN HFA) 108 (90 Base) MCG/ACT inhaler Inhale 1-2 puffs into the lungs every 4 (four) hours as needed for wheezing or shortness of breath. 02/14/23   Becky Augusta, NP  cetirizine (ZYRTEC) 10 MG tablet Take 10 mg by mouth daily.    [provider]  fluticasone (FLONASE) 50 MCG/ACT nasal spray Place 2 sprays into both nostrils daily. 03/30/21   Domenick Gong, MD  ibuprofen (ADVIL) 600 MG tablet Take 1 tablet (600 mg total) by mouth every 6 (six) hours as needed. 03/30/21   Domenick Gong, MD  Nutritional Supplements (ESTROVEN PO) Take 1 tablet by mouth daily.    [provider]  Spacer/Aero-Holding Chambers (AEROCHAMBER PLUS) inhaler Use with inhaler 03/30/21   Domenick Gong, MD  fexofenadine (ALLEGRA) 180 MG tablet Take by mouth.  10/08/19  [provider]  gabapentin (NEURONTIN) 800 MG tablet Take 1 tablet (800 mg total) by mouth at bedtime for 14 days. Take nightly for 3 days, then up to 14 days as needed 07/04/18 07/08/19  Christeen Douglas, MD    Family History Family History  Problem Relation Age of Onset   Hypertension Mother  Hypertension Father    Stroke Father    Breast cancer Neg Hx     Social History Social History   Tobacco Use   Smoking status: Every Day    Current packs/day: 0.25    Types: Cigarettes   Smokeless tobacco: Never  Vaping Use   Vaping status: Never Used  Substance Use Topics   Alcohol use: No   Drug use: No     Allergies   Patient has no known allergies.   Review of Systems Review of Systems  Constitutional:  Positive for fatigue. Negative for fever.  HENT:  Positive for congestion and sore  throat. Negative for ear pain, rhinorrhea and sinus pressure.   Respiratory:  Positive for cough and shortness of breath. Negative for wheezing.   Musculoskeletal:  Positive for arthralgias and myalgias.  Neurological:  Positive for headaches.     Physical Exam Triage Vital Signs ED Triage Vitals  Encounter Vitals Group     BP 02/14/23 1020 128/78     Systolic BP Percentile --      Diastolic BP Percentile --      Pulse Rate 02/14/23 1020 72     Resp 02/14/23 1020 15     Temp 02/14/23 1020 97.8 F (36.6 C)     Temp Source 02/14/23 1020 Oral     SpO2 02/14/23 1020 96 %     Weight 02/14/23 1018 199 lb 15.3 oz (90.7 kg)     Height 02/14/23 1018 5\' 6"  (1.676 m)     Head Circumference --      Peak Flow --      Pain Score 02/14/23 1018 0     Pain Loc --      Pain Education --      Exclude from Growth Chart --    No data found.  Updated Vital Signs BP 128/78 (BP Location: Left Arm)   Pulse 72   Temp 97.8 F (36.6 C) (Oral)   Resp 15   Ht 5\' 6"  (1.676 m)   Wt 199 lb 15.3 oz (90.7 kg)   SpO2 96%   BMI 32.27 kg/m   Visual Acuity Right Eye Distance:   Left Eye Distance:   Bilateral Distance:    Right Eye Near:   Left Eye Near:    Bilateral Near:     Physical Exam Vitals and nursing note reviewed.  Constitutional:      Appearance: Normal appearance. She is not ill-appearing.  HENT:     Head: Normocephalic and atraumatic.     Right Ear: Tympanic membrane, ear canal and external ear normal. There is no impacted cerumen.     Left Ear: Tympanic membrane, ear canal and external ear normal. There is no impacted cerumen.     Nose: Congestion and rhinorrhea present.     Comments: Nasal mucosa is erythematous and edematous with clear discharge in both nares.    Mouth/Throat:     Mouth: Mucous membranes are moist.     Pharynx: Oropharynx is clear. Posterior oropharyngeal erythema present. No oropharyngeal exudate.     Comments: Tonsillar pillars are unremarkable.  Posterior  oropharynx demonstrates erythema and injection with clear postnasal drip. Cardiovascular:     Rate and Rhythm: Normal rate and regular rhythm.     Pulses: Normal pulses.     Heart sounds: Normal heart sounds. No murmur heard.    No friction rub. No gallop.  Pulmonary:     Effort: Pulmonary effort is normal.  Breath sounds: Normal breath sounds. No wheezing, rhonchi or rales.  Musculoskeletal:     Cervical back: Normal range of motion and neck supple. No tenderness.  Lymphadenopathy:     Cervical: No cervical adenopathy.  Skin:    General: Skin is warm and dry.     Capillary Refill: Capillary refill takes less than 2 seconds.     Findings: No rash.  Neurological:     General: No focal deficit present.     Mental Status: She is alert and oriented to person, place, and time.      UC Treatments / Results  Labs (all labs ordered are listed, but only abnormal results are displayed) Labs Reviewed  GROUP A STREP BY PCR  RESP PANEL BY RT-PCR (FLU A&B, COVID) [id number]    EKG   Radiology No results found.  Procedures Procedures (including critical care time)  Medications Ordered in UC Medications - No data to display  Initial Impression / Assessment and Plan / UC Course  I have reviewed the triage vital signs and the nursing notes.  Pertinent labs & imaging results that were available during my care of the patient were reviewed by me and considered in my medical decision making (see chart for details).   Patient is a pleasant, nontoxic-appearing 53 year old female presenting for evaluation of 4 days with the respiratory symptoms as outlined HPI above.  In the exam room she is mildly ill-appearing but not in any acute distress.  She is able to speak in full sentence without dyspnea or tachypnea.  Room air oxygen saturation is 96%.  She is afebrile with an oral temp of 97.8.  Her physical exam does reveal inflammation of her upper respiratory tract as well as erythema to the  posterior oropharynx.  Her cardiopulmonary exam reveals clear lung sounds in all fields.  Differential diagnosis include COVID, influenza, strep throat, or viral respiratory illness.  I will order a COVID and flu PCR along with a strep PCR.  Strep PCR is negative.  Respiratory panel is negative for COVID and influenza.  I will discharge patient home with a diagnosis of viral URI with a cough and prescribe Atrovent nasal spray to help with nasal congestion along with Tessalon Perles and Promethazine DM cough syrup for cough and congestion.  Additionally, I will prescribe an albuterol inhaler that she can use as needed for shortness of breath.  Tylenol and ibuprofen needed for fever or pain.  Return precautions reviewed.  Work note provided.   Final Clinical Impressions(s) / UC Diagnoses   Final diagnoses:  Viral URI with cough     Discharge Instructions      Your strep test was negative and your respiratory panel was negative for COVID influenza.  I do believe you have a viral respiratory illness causing your symptoms.  Use over-the-counter Tylenol and ibuprofen according the package instructions as needed for any fever or pain.  Use the albuterol inhaler, 1 to 2 puffs every 4-6 hours, as needed for shortness of breath or wheezing.  Use the Atrovent nasal spray, 2 squirts in each nostril every 6 hours, as needed for runny nose and postnasal drip.  Use the Tessalon Perles every 8 hours during the day.  Take them with a small sip of water.  They may give you some numbness to the base of your tongue or a metallic taste in your mouth, this is normal.  Use the Promethazine DM cough syrup at bedtime for cough and congestion.  It will  make you drowsy so do not take it during the day.  Return for reevaluation or see your primary care provider for any new or worsening symptoms.      ED Prescriptions     Medication Sig Dispense Auth. Provider   albuterol (VENTOLIN HFA) 108 (90 Base)  MCG/ACT inhaler Inhale 1-2 puffs into the lungs every 4 (four) hours as needed for wheezing or shortness of breath. 18 g Becky Augusta, NP   benzonatate (TESSALON) 100 MG capsule Take 2 capsules (200 mg total) by mouth every 8 (eight) hours. 21 capsule Becky Augusta, NP   ipratropium (ATROVENT) 0.06 % nasal spray Place 2 sprays into both nostrils 4 (four) times daily. 15 mL Becky Augusta, NP   promethazine-dextromethorphan (PROMETHAZINE-DM) 6.25-15 MG/5ML syrup Take 5 mLs by mouth 4 (four) times daily as needed. 118 mL Becky Augusta, NP      PDMP not reviewed this encounter.   Becky Augusta, NP 02/14/23 1110

## 2023-02-14 NOTE — ED Triage Notes (Signed)
Patient reports cough, chest congestion, fatigue and sore throat that started on Wed.  Patient unsure of fevers.

## 2023-02-14 NOTE — Discharge Instructions (Signed)
Your strep test was negative and your respiratory panel was negative for COVID influenza.  I do believe you have a viral respiratory illness causing your symptoms.  Use over-the-counter Tylenol and ibuprofen according the package instructions as needed for any fever or pain.  Use the albuterol inhaler, 1 to 2 puffs every 4-6 hours, as needed for shortness of breath or wheezing.  Use the Atrovent nasal spray, 2 squirts in each nostril every 6 hours, as needed for runny nose and postnasal drip.  Use the Tessalon Perles every 8 hours during the day.  Take them with a small sip of water.  They may give you some numbness to the base of your tongue or a metallic taste in your mouth, this is normal.  Use the Promethazine DM cough syrup at bedtime for cough and congestion.  It will make you drowsy so do not take it during the day.  Return for reevaluation or see your primary care provider for any new or worsening symptoms.

## 2023-03-07 ENCOUNTER — Ambulatory Visit
Admission: EM | Admit: 2023-03-07 | Discharge: 2023-03-07 | Disposition: A | Discharge status: Home / Self Care | Payer: 59 | Attending: Emergency Medicine | Admitting: Emergency Medicine

## 2023-03-07 DIAGNOSIS — J0140 Acute pansinusitis, unspecified: Secondary | ICD-10-CM

## 2023-03-07 DIAGNOSIS — R030 Elevated blood-pressure reading, without diagnosis of hypertension: Secondary | ICD-10-CM

## 2023-03-07 MED ORDER — AEROCHAMBER MV MISC: 1 refills | Status: DC

## 2023-03-07 MED ORDER — AMOXICILLIN-POT CLAVULANATE 875-125 MG PO TABS: 1.0000 | ORAL_TABLET | Freq: Two times a day (BID) | ORAL | 0 refills | Status: DC

## 2023-03-07 MED ORDER — IBUPROFEN 600 MG PO TABS: 600.0000 mg | ORAL_TABLET | Freq: Three times a day (TID) | ORAL | 0 refills | Status: AC | PRN

## 2023-03-07 MED ORDER — PREDNISONE 20 MG PO TABS: 40.0000 mg | ORAL_TABLET | Freq: Every day | ORAL | 0 refills | Status: AC

## 2023-03-07 NOTE — ED Provider Notes (Signed)
 HPI  SUBJECTIVE:  Hannah Rodgers is a 53 y.o. female who presents with right-sided headache, maxillary sinus pain and pressure, upper dental pain, nasal congestion, postnasal drip, sore throat.  She reports right-sided facial swelling this morning.  No fevers above 100.4.  She states that she is still coughing at night and has substernal chest soreness from the coughing although this is getting better.  She has some wheezing and shortness of breath, but this resolves with albuterol.  She took an antipyretic within 6 hours of evaluation.  Patient was seen here on 1/26 with fatigue, nasal congestion, sore throat, headache.  COVID, flu, strep negative.  Sent home with Atrovent nasal spray, Tessalon Perles, Promethazine DM, and albuterol inhaler.   She states that the albuterol, Tessalon, Tylenol 1000 mg p.o. twice daily is helping with her symptoms.  She has also tried DayQuil, NyQuil.  No aggravating factors.  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  She has no past medical history.  PCP: [facility]  Past Medical History:  Diagnosis Date   Anxiety    Depression    Skin cancer 2020   nose, arms    Past Surgical History:  Procedure Laterality Date   ABDOMINAL HYSTERECTOMY     CESAREAN SECTION     x2   CYSTOSCOPY N/A 07/04/2018   Procedure: CYSTOSCOPY;  Surgeon: Christeen Douglas, MD;  Location: [facility];  Service: Gynecology;  Laterality: N/A;   LAPAROSCOPIC BILATERAL SALPINGECTOMY Bilateral 07/04/2018   Procedure: LAPAROSCOPIC BILATERAL SALPINGECTOMY;  Surgeon: Christeen Douglas, MD;  Location: [facility];  Service: Gynecology;  Laterality: Bilateral;   LAPAROSCOPIC HYSTERECTOMY N/A 07/04/2018   Procedure: HYSTERECTOMY TOTAL LAPAROSCOPIC;  Surgeon: Christeen Douglas, MD;  Location: [facility];  Service: Gynecology;  Laterality: N/A;   TONSILLECTOMY      Family History  Problem Relation Age of Onset   Hypertension Mother     Hypertension Father    Stroke Father    Breast cancer Neg Hx     Social History   Tobacco Use   Smoking status: Every Day    Current packs/day: 0.25    Types: Cigarettes   Smokeless tobacco: Never  Vaping Use   Vaping status: Never Used  Substance Use Topics   Alcohol use: No   Drug use: No    No current facility-administered medications for this encounter.  Current Outpatient Medications:    acetaminophen (TYLENOL) 500 MG tablet, TAKE 2 TABLETS (1,000 MG TOTAL) BY MOUTH EVERY 6 (SIX) HOURS FOR 3 DAYS., Disp: , Rfl:    albuterol (VENTOLIN HFA) 108 (90 Base) MCG/ACT inhaler, Inhale 1-2 puffs into the lungs every 4 (four) hours as needed for wheezing or shortness of breath., Disp: 18 g, Rfl: 0   amoxicillin-clavulanate (AUGMENTIN) 875-125 MG tablet, Take 1 tablet by mouth every 12 (twelve) hours., Disp: 14 tablet, Rfl: 0   fluticasone (FLONASE) 50 MCG/ACT nasal spray, Place 2 sprays into both nostrils daily., Disp: 16 g, Rfl: 0   ibuprofen (ADVIL) 600 MG tablet, Take 1 tablet (600 mg total) by mouth every 8 (eight) hours as needed., Disp: 30 tablet, Rfl: 0   ipratropium (ATROVENT) 0.06 % nasal spray, Place 2 sprays into both nostrils 4 (four) times daily., Disp: 15 mL, Rfl: 12   Nutritional Supplements (ESTROVEN PO), Take 1 tablet by mouth daily., Disp: , Rfl:    predniSONE (DELTASONE) 20 MG tablet, Take 2 tablets (40 mg total) by mouth daily with breakfast  for 5 days., Disp: 10 tablet, Rfl: 0   sertraline (ZOLOFT) 100 MG tablet, Take 150 mg by mouth at bedtime. , Disp: , Rfl:    Spacer/Aero-Holding Chambers (AEROCHAMBER MV) inhaler, Use as instructed, Disp: 1 each, Rfl: 1   benzonatate (TESSALON) 100 MG capsule, Take 2 capsules (200 mg total) by mouth every 8 (eight) hours., Disp: 21 capsule, Rfl: 0   promethazine-dextromethorphan (PROMETHAZINE-DM) 6.25-15 MG/5ML syrup, Take 5 mLs by mouth 4 (four) times daily as needed., Disp: 118 mL, Rfl: 0  No Known Allergies   ROS  As noted  in HPI.   Physical Exam  BP (!) (P) 155/105 (BP Location: Left Arm)   Pulse 72   Temp 98.3 F (36.8 C) (Oral)   Resp (P) 12   Ht 5\' 6"  (1.676 m)   Wt 95.3 kg   SpO2 100%   BMI 33.89 kg/m   Constitutional: Well developed, well nourished, no acute distress Eyes: PERRL, EOMI, conjunctiva normal bilaterally HENT: Normocephalic, atraumatic,mucus membranes moist.  Purulent nasal congestion.  Erythematous, swollen turbinates.  Positive exquisite maxillary and frontal sinus tenderness, worse on the right.  No appreciable facial swelling.  Tonsils surgically absent.  No obvious postnasal drip. Respiratory: Clear to auscultation bilaterally, no rales, no wheezing, no rhonchi, good air movement.  Positive anterior, lateral chest wall tenderness Cardiovascular: Normal rate and rhythm, no murmurs, no gallops, no rubs GI: nondistended skin: No rash, skin intact Musculoskeletal: , no deformities Neurologic: Alert & oriented x 3, CN III-XII grossly intact, no motor deficits, sensation grossly intact Psychiatric: Speech and behavior appropriate   ED Course   Medications - No data to display  No orders of the defined types were placed in this encounter.  No results found for this or any previous visit (from the past 24 hours). No results found.  ED Clinical Impression  1. Acute non-recurrent pansinusitis   2. Elevated blood pressure reading without diagnosis of hypertension      ED Assessment/Plan     Previous records reviewed.  As noted in HPI.  Patient presents with an acute pansinusitis.  Home with Augmentin, prednisone 40 mg for 5 days because of the severe symptoms.  Continue saline nasal irrigation, will add ibuprofen to her Tylenol and increase it to 3 times a day.  She does not need a refill on her Tessalon.  Will prescribe a spacer for her albuterol inhaler.  Follow-up with PCP as needed.  ER return precautions given.  Patient to monitor her blood pressure at home.  I  suspect it is elevated due to the acute illness.  Discussed MDM, treatment plan, and plan for follow-up with patient Discussed sn/sx that should prompt return to the ED. patient agrees with plan.   Meds ordered this encounter  Medications   amoxicillin-clavulanate (AUGMENTIN) 875-125 MG tablet    Sig: Take 1 tablet by mouth every 12 (twelve) hours.    Dispense:  14 tablet    Refill:  0   predniSONE (DELTASONE) 20 MG tablet    Sig: Take 2 tablets (40 mg total) by mouth daily with breakfast for 5 days.    Dispense:  10 tablet    Refill:  0   Spacer/Aero-Holding Chambers (AEROCHAMBER MV) inhaler    Sig: Use as instructed    Dispense:  1 each    Refill:  1   ibuprofen (ADVIL) 600 MG tablet    Sig: Take 1 tablet (600 mg total) by mouth every 8 (eight) hours as needed.  Dispense:  30 tablet    Refill:  0      *This clinic note was created using Scientist, clinical (histocompatibility and immunogenetics). Therefore, there may be occasional mistakes despite careful proofreading. ?    Domenick Gong, MD 03/08/23 [phone]

## 2023-03-07 NOTE — ED Triage Notes (Signed)
 Pt c/o cough, chest congestion, x3weeks  Pt states that she was seen 3 weeks ago and tested negative for covid and flu.  Pt states that the cough slightly improved, but she is now having facial pain, headache, jaw pain, and fatigue.

## 2023-03-07 NOTE — Discharge Instructions (Signed)
 Stop any cetirizine or other antihistamines while you have an infection.  Finish the Augmentin and prednisone, even if you feel better.  You may take 600 mg of motrin with 1000 mg of tylenol up to 3-4 times a day as needed for pain. This is an effective combination for pain.  Use saline nasal irrigation with your NeilMed sinus rinse.  2 puffs from your albuterol inhaler using your spacer every 4-6 hours as needed.  If the spacer is too expensive, you can get an AeroChamber Z stat off of [employer] for $10-$15.  Go to www.goodrx.com to look up your medications. This will give you a list of where you can find your prescriptions at the most affordable prices. Or you can ask the pharmacist what the cash price is. This is frequently cheaper than going through insurance.

## 2023-07-08 ENCOUNTER — Ambulatory Visit
Admission: EM | Admit: 2023-07-08 | Discharge: 2023-07-08 | Disposition: A | Discharge status: Home / Self Care | Attending: Emergency Medicine | Admitting: Emergency Medicine

## 2023-07-08 DIAGNOSIS — J0140 Acute pansinusitis, unspecified: Secondary | ICD-10-CM

## 2023-07-08 MED ORDER — AMOXICILLIN-POT CLAVULANATE 875-125 MG PO TABS: 1.0000 | ORAL_TABLET | Freq: Two times a day (BID) | ORAL | 0 refills | Status: AC

## 2023-07-08 MED ORDER — IPRATROPIUM BROMIDE 0.06 % NA SOLN: 2.0000 | Freq: Four times a day (QID) | NASAL | 12 refills | Status: DC

## 2023-07-08 NOTE — Discharge Instructions (Addendum)
 The Augmentin  twice daily with food for 10 days for treatment of your sinusitis.  Perform sinus irrigation 2-3 times a day with a NeilMed sinus rinse kit and distilled water.  Do not use tap water.  Use the Atrovent  nasal spray, 2 squirts to be/every 6 hours, to help with nasal congestion.  You can use plain over-the-counter Mucinex  every 6 hours to break up the stickiness of the mucus so your body can clear it.  Increase your oral fluid intake to thin out your mucus so that is also able for your body to clear more easily.  Take an over-the-counter probiotic, such as Culturelle-align-activia, 1 hour after each dose of antibiotic to prevent diarrhea.  If you develop any new or worsening symptoms return for reevaluation or see your primary care provider.

## 2023-07-08 NOTE — ED Triage Notes (Signed)
 Pt st's she thinks she has a sinus infection  c/o pain in right side of face x's 2 weeks.  St's has taken OTC meds without relief

## 2023-07-08 NOTE — ED Provider Notes (Signed)
 [facility]    CSN: [id number] Arrival date & time: 07/08/23  1508      History   Chief Complaint Chief Complaint  Patient presents with   Sinus Problems    HPI Hannah Rodgers is a 53 y.o. female.   HPI  53 year old female with past medical history significant for skin cancer, anxiety, depression presents for evaluation of 2 weeks worth of sinus pain and pressure that is mostly on the right side of her face.  She reports that it is in her forehead, behind her right eye, and in her right cheek.  It comes down into the top of her teeth.  She is getting some nasal discharge out but is mostly been clear that she is also experiencing postnasal drip and a nonproductive cough.  No fever.  Past Medical History:  Diagnosis Date   Anxiety    Depression    Skin cancer 2020   nose, arms    There are no active problems to display for this patient.   Past Surgical History:  Procedure Laterality Date   ABDOMINAL HYSTERECTOMY     CESAREAN SECTION     x2   CYSTOSCOPY N/A 07/04/2018   Procedure: CYSTOSCOPY;  Surgeon: Prescilla Brod, MD;  Location: [facility];  Service: Gynecology;  Laterality: N/A;   LAPAROSCOPIC BILATERAL SALPINGECTOMY Bilateral 07/04/2018   Procedure: LAPAROSCOPIC BILATERAL SALPINGECTOMY;  Surgeon: Prescilla Brod, MD;  Location: [facility];  Service: Gynecology;  Laterality: Bilateral;   LAPAROSCOPIC HYSTERECTOMY N/A 07/04/2018   Procedure: HYSTERECTOMY TOTAL LAPAROSCOPIC;  Surgeon: Prescilla Brod, MD;  Location: [facility];  Service: Gynecology;  Laterality: N/A;   TONSILLECTOMY      OB History   No obstetric history on file.      Home Medications    Prior to Admission medications   Medication Sig Start Date End Date Taking? Authorizing Provider  amoxicillin -clavulanate (AUGMENTIN ) 875-125 MG tablet Take 1 tablet by mouth every 12 (twelve) hours for 10 days. 07/08/23 07/18/23 Yes Kent Pear, NP  ipratropium (ATROVENT ) 0.06 % nasal spray Place 2  sprays into both nostrils 4 (four) times daily. 07/08/23  Yes Kent Pear, NP  acetaminophen  (TYLENOL ) 500 MG tablet TAKE 2 TABLETS (1,000 MG TOTAL) BY MOUTH EVERY 6 (SIX) HOURS FOR 3 DAYS. 07/04/18   [provider]  albuterol  (VENTOLIN  HFA) 108 (90 Base) MCG/ACT inhaler Inhale 1-2 puffs into the lungs every 4 (four) hours as needed for wheezing or shortness of breath. 02/14/23   Kent Pear, NP  ibuprofen  (ADVIL ) 600 MG tablet Take 1 tablet (600 mg total) by mouth every 8 (eight) hours as needed. 03/07/23   Ethlyn Herd, MD  Nutritional Supplements (ESTROVEN PO) Take 1 tablet by mouth daily.    [provider]  sertraline (ZOLOFT) 100 MG tablet Take 150 mg by mouth at bedtime.     [provider]  fexofenadine (ALLEGRA) 180 MG tablet Take by mouth.  10/08/19  [provider]  gabapentin  (NEURONTIN ) 800 MG tablet Take 1 tablet (800 mg total) by mouth at bedtime for 14 days. Take nightly for 3 days, then up to 14 days as needed 07/04/18 07/08/19  Prescilla Brod, MD    Family History Family History  Problem Relation Age of Onset   Hypertension Mother    Hypertension Father    Stroke Father    Breast cancer Neg Hx     Social History Social History   Tobacco Use   Smoking status: Every Day  Current packs/day: 0.25    Types: Cigarettes   Smokeless tobacco: Never  Vaping Use   Vaping status: Never Used  Substance Use Topics   Alcohol use: No   Drug use: No     Allergies   Patient has no known allergies.   Review of Systems Review of Systems  Constitutional:  Negative for fever.  HENT:  Positive for congestion, postnasal drip, rhinorrhea and sinus pain. Negative for ear pain.   Respiratory:  Positive for cough. Negative for shortness of breath and wheezing.      Physical Exam Triage Vital Signs ED Triage Vitals  Encounter Vitals Group     BP      Girls Systolic BP Percentile      Girls Diastolic BP Percentile      Boys Systolic  BP Percentile      Boys Diastolic BP Percentile      Pulse      Resp      Temp      Temp src      SpO2      Weight      Height      Head Circumference      Peak Flow      Pain Score      Pain Loc      Pain Education      Exclude from Growth Chart    No data found.  Updated Vital Signs BP (!) 147/78 (BP Location: Left Arm)   Pulse 78   Temp 98.2 F (36.8 C) (Oral)   Resp 18   SpO2 100%   Visual Acuity Right Eye Distance:   Left Eye Distance:   Bilateral Distance:    Right Eye Near:   Left Eye Near:    Bilateral Near:     Physical Exam Vitals and nursing note reviewed.  Constitutional:      Appearance: Normal appearance. She is ill-appearing.  HENT:     Head: Normocephalic and atraumatic.     Right Ear: Ear canal and external ear normal. There is no impacted cerumen.     Left Ear: Ear canal and external ear normal. There is no impacted cerumen.     Ears:     Comments: Mild erythema to both tympanic membranes.  Both EACs are clear.    Nose: Congestion and rhinorrhea present.     Comments: Marked edema to her nasal mucosa bilaterally.  Bloody discharge present in the left nare.  Bilateral frontal and maxillary sinuses are tender to compression.    Mouth/Throat:     Mouth: Mucous membranes are moist.     Pharynx: Oropharynx is clear. No oropharyngeal exudate or posterior oropharyngeal erythema.   Cardiovascular:     Rate and Rhythm: Normal rate and regular rhythm.     Pulses: Normal pulses.     Heart sounds: Normal heart sounds. No murmur heard.    No friction rub. No gallop.  Pulmonary:     Effort: Pulmonary effort is normal.     Breath sounds: Normal breath sounds. No wheezing, rhonchi or rales.   Musculoskeletal:     Cervical back: Normal range of motion and neck supple. No tenderness.  Lymphadenopathy:     Cervical: No cervical adenopathy.   Skin:    General: Skin is warm and dry.     Capillary Refill: Capillary refill takes less than 2 seconds.      Findings: No rash.   Neurological:     General: No focal  deficit present.     Mental Status: She is alert and oriented to person, place, and time.      UC Treatments / Results  Labs (all labs ordered are listed, but only abnormal results are displayed) Labs Reviewed - No data to display  EKG   Radiology No results found.  Procedures Procedures (including critical care time)  Medications Ordered in UC Medications - No data to display  Initial Impression / Assessment and Plan / UC Course  I have reviewed the triage vital signs and the nursing notes.  Pertinent labs & imaging results that were available during my care of the patient were reviewed by me and considered in my medical decision making (see chart for details).   Patient is a pleasant, though mildly ill-appearing, 53 year old female presenting for evaluation of 2 weeks worth of sinus pain and pressure as outlined HPI above.  Her physical exam does reveal marked edema of her nasal mucosa with bloody discharge in the left nare.  She has marked tenderness to compression of bilateral maxillary sinuses.  She also has mild erythema to both tympanic membranes but no appreciable effusion and both EACs are clear.  Her cardiopulmonary exam is benign.  Patient exam is consistent with pansinusitis.  She has been using sinus irrigation with minimal relief of symptoms.  I have advised her to continue using sinus irrigation, increase her oral fluid intake, and add Mucinex  onto help break up the stickiness of her mucus.  I will start her on Augmentin  875 twice daily for 10 days.  Additionally I will prescribe Atrovent  nasal spray to help with the nasal congestion and see if it can facilitate relief of her mucus burden.   Final Clinical Impressions(s) / UC Diagnoses   Final diagnoses:  Acute non-recurrent pansinusitis     Discharge Instructions      The Augmentin  twice daily with food for 10 days for treatment of your  sinusitis.  Perform sinus irrigation 2-3 times a day with a NeilMed sinus rinse kit and distilled water.  Do not use tap water.  Use the Atrovent  nasal spray, 2 squirts to be/every 6 hours, to help with nasal congestion.  You can use plain over-the-counter Mucinex  every 6 hours to break up the stickiness of the mucus so your body can clear it.  Increase your oral fluid intake to thin out your mucus so that is also able for your body to clear more easily.  Take an over-the-counter probiotic, such as Culturelle-align-activia, 1 hour after each dose of antibiotic to prevent diarrhea.  If you develop any new or worsening symptoms return for reevaluation or see your primary care provider.      ED Prescriptions     Medication Sig Dispense Auth. Provider   amoxicillin -clavulanate (AUGMENTIN ) 875-125 MG tablet Take 1 tablet by mouth every 12 (twelve) hours for 10 days. 20 tablet Kent Pear, NP   ipratropium (ATROVENT ) 0.06 % nasal spray Place 2 sprays into both nostrils 4 (four) times daily. 15 mL Kent Pear, NP      PDMP not reviewed this encounter.   Kent Pear, NP 07/08/23 [id number]

## 2023-07-28 ENCOUNTER — Other Ambulatory Visit: Payer: Self-pay | Admitting: Student

## 2023-07-28 DIAGNOSIS — Z1231 Encounter for screening mammogram for malignant neoplasm of breast: Secondary | ICD-10-CM

## 2023-12-17 ENCOUNTER — Ambulatory Visit
Admission: RE | Admit: 2023-12-17 | Discharge: 2023-12-17 | Disposition: A | Discharge status: Home / Self Care | Payer: Self-pay | Source: Ambulatory Visit

## 2023-12-17 VITALS — BP 132/82 | HR 80 | Temp 98.2°F | Resp 14 | Ht 66.0 in | Wt 210.1 lb

## 2023-12-17 DIAGNOSIS — J22 Unspecified acute lower respiratory infection: Secondary | ICD-10-CM

## 2023-12-17 MED ORDER — PREDNISONE 20 MG PO TABS: 60.0000 mg | ORAL_TABLET | Freq: Every day | ORAL | 0 refills | Status: AC

## 2023-12-17 MED ORDER — CEFDINIR 300 MG PO CAPS: 300.0000 mg | ORAL_CAPSULE | Freq: Two times a day (BID) | ORAL | 0 refills | Status: AC

## 2023-12-17 MED ORDER — BENZONATATE 100 MG PO CAPS: 200.0000 mg | ORAL_CAPSULE | Freq: Three times a day (TID) | ORAL | 0 refills | Status: AC

## 2023-12-17 MED ORDER — PROMETHAZINE-DM 6.25-15 MG/5ML PO SYRP: 5.0000 mL | ORAL_SOLUTION | Freq: Four times a day (QID) | ORAL | 0 refills | Status: AC | PRN

## 2023-12-17 MED ORDER — ALBUTEROL SULFATE HFA 108 (90 BASE) MCG/ACT IN AERS: 1.0000 | INHALATION_SPRAY | RESPIRATORY_TRACT | 0 refills | Status: AC | PRN

## 2023-12-17 NOTE — Discharge Instructions (Addendum)
 Take the cefdinir twice daily with food for 7 days to cover for potential bacterial sources of your respiratory symptoms.  Use the Tessalon  Perles every 8 hours during the day as needed for cough.  Take them with a small sip of water.  They may give you numbness to the base of your tongue, or metallic taste in your mouth, this is normal.  Use the Promethazine  DM cough syrup at bedtime as needed for cough and congestion.  When you pick up your medication I will also prescribe you prednisone  which should take your first dose when you pick up your medication.  You will take 60 mg of prednisone  daily for the next 5 days to help decrease lower respiratory inflammation and improve your breathing.  Take the medicine at breakfast time going forward.  Some your symptoms are also concerning for possible sleep apnea.  I will talk with your PCP if about ordering a sleep study.  He can order a home sleep study from [facility].  Use the albuterol  inhaler, 1 to 2 puffs every 4-6 hours, as needed for cough or chest congestion.  If you develop any new or worsening symptoms please return for reevaluation or see your PCP.

## 2023-12-17 NOTE — ED Provider Notes (Signed)
 [facility]    CSN: [id number] Arrival date & time: 12/17/23  9071      History   Chief Complaint Chief Complaint  Patient presents with   Cough    Appointment    HPI AKYIA BORELLI is a 53 y.o. female.   HPI  53 year old female with past medical history significant for skin cancer, anxiety, depression presents for evaluation of 3 weeks worth of significant sore throat, headache, cough, and chest congestion.  She denies any fever, runny nose, nasal congestion.  Also no shortness breath or wheezing.  Her cough is intermittently productive for thick sputum.  She also endorses significant fatigue.  No history of sleep apnea.  Past Medical History:  Diagnosis Date   Anxiety    Depression    Skin cancer 2020   nose, arms    There are no active problems to display for this patient.   Past Surgical History:  Procedure Laterality Date   ABDOMINAL HYSTERECTOMY     CESAREAN SECTION     x2   CYSTOSCOPY N/A 07/04/2018   Procedure: CYSTOSCOPY;  Surgeon: Verdon Keen, MD;  Location: [facility];  Service: Gynecology;  Laterality: N/A;   LAPAROSCOPIC BILATERAL SALPINGECTOMY Bilateral 07/04/2018   Procedure: LAPAROSCOPIC BILATERAL SALPINGECTOMY;  Surgeon: Verdon Keen, MD;  Location: [facility];  Service: Gynecology;  Laterality: Bilateral;   LAPAROSCOPIC HYSTERECTOMY N/A 07/04/2018   Procedure: HYSTERECTOMY TOTAL LAPAROSCOPIC;  Surgeon: Verdon Keen, MD;  Location: [facility];  Service: Gynecology;  Laterality: N/A;   TONSILLECTOMY      OB History   No obstetric history on file.      Home Medications    Prior to Admission medications   Medication Sig Start Date End Date Taking? Authorizing Provider  benzonatate  (TESSALON ) 100 MG capsule Take 2 capsules (200 mg total) by mouth every 8 (eight) hours. 12/17/23  Yes Bernardino Ditch, NP  cefdinir (OMNICEF) 300 MG capsule Take 1 capsule (300 mg total) by mouth 2 (two) times daily for 7 days. 12/17/23 12/24/23 Yes  Bernardino Ditch, NP  predniSONE  (DELTASONE ) 20 MG tablet Take 3 tablets (60 mg total) by mouth daily with breakfast for 5 days. 3 tablets daily for 5 days. 12/17/23 12/22/23 Yes Bernardino Ditch, NP  promethazine -dextromethorphan (PROMETHAZINE -DM) 6.25-15 MG/5ML syrup Take 5 mLs by mouth 4 (four) times daily as needed. 12/17/23  Yes Bernardino Ditch, NP  acetaminophen  (TYLENOL ) 500 MG tablet TAKE 2 TABLETS (1,000 MG TOTAL) BY MOUTH EVERY 6 (SIX) HOURS FOR 3 DAYS. 07/04/18   [provider]  albuterol  (VENTOLIN  HFA) 108 (90 Base) MCG/ACT inhaler Inhale 1-2 puffs into the lungs every 4 (four) hours as needed for wheezing or shortness of breath. 12/17/23   Bernardino Ditch, NP  ibuprofen  (ADVIL ) 600 MG tablet Take 1 tablet (600 mg total) by mouth every 8 (eight) hours as needed. 03/07/23   Mortenson, Ashley, MD  montelukast (SINGULAIR) 10 MG tablet Take 10 mg by mouth daily.    [provider]  Nutritional Supplements (ESTROVEN PO) Take 1 tablet by mouth daily.    [provider]  sertraline (ZOLOFT) 100 MG tablet Take 150 mg by mouth at bedtime.     [provider]  fexofenadine (ALLEGRA) 180 MG tablet Take by mouth.  10/08/19  [provider]  gabapentin  (NEURONTIN ) 800 MG tablet Take 1 tablet (800 mg total) by mouth at bedtime for 14 days. Take nightly for 3 days, then up to 14 days as needed 07/04/18 07/08/19  Verdon Keen,  MD    Family History Family History  Problem Relation Age of Onset   Hypertension Mother    Hypertension Father    Stroke Father    Breast cancer Neg Hx     Social History Social History   Tobacco Use   Smoking status: Every Day    Current packs/day: 0.25    Types: Cigarettes   Smokeless tobacco: Never  Vaping Use   Vaping status: Never Used  Substance Use Topics   Alcohol use: No   Drug use: No     Allergies   Patient has no known allergies.   Review of Systems Review of Systems  Constitutional:  Positive for fatigue.  Negative for fever.  HENT:  Positive for sore throat. Negative for congestion, ear pain and rhinorrhea.   Respiratory:  Positive for cough. Negative for shortness of breath and wheezing.   Neurological:  Positive for headaches.     Physical Exam Triage Vital Signs ED Triage Vitals  Encounter Vitals Group     BP      Girls Systolic BP Percentile      Girls Diastolic BP Percentile      Boys Systolic BP Percentile      Boys Diastolic BP Percentile      Pulse      Resp      Temp      Temp src      SpO2      Weight      Height      Head Circumference      Peak Flow      Pain Score      Pain Loc      Pain Education      Exclude from Growth Chart    No data found.  Updated Vital Signs BP 132/82 (BP Location: Left Arm)   Pulse 80   Temp 98.2 F (36.8 C) (Oral)   Resp 14   Ht 5' 6 (1.676 m)   Wt 210 lb 1.6 oz (95.3 kg)   SpO2 96%   BMI 33.91 kg/m   Visual Acuity Right Eye Distance:   Left Eye Distance:   Bilateral Distance:    Right Eye Near:   Left Eye Near:    Bilateral Near:     Physical Exam Vitals and nursing note reviewed.  Constitutional:      Appearance: Normal appearance. She is not ill-appearing.  HENT:     Head: Normocephalic and atraumatic.     Nose: Nose normal. No congestion or rhinorrhea.     Mouth/Throat:     Mouth: Mucous membranes are moist.     Pharynx: Oropharynx is clear. No oropharyngeal exudate or posterior oropharyngeal erythema.  Cardiovascular:     Rate and Rhythm: Normal rate and regular rhythm.     Pulses: Normal pulses.     Heart sounds: Normal heart sounds. No murmur heard.    No friction rub. No gallop.  Pulmonary:     Effort: Pulmonary effort is normal.     Breath sounds: Normal breath sounds. No wheezing, rhonchi or rales.  Musculoskeletal:     Cervical back: Normal range of motion and neck supple. No tenderness.  Lymphadenopathy:     Cervical: No cervical adenopathy.  Skin:    General: Skin is warm and dry.      Capillary Refill: Capillary refill takes less than 2 seconds.     Findings: No rash.  Neurological:     General: No focal deficit  present.     Mental Status: She is alert and oriented to person, place, and time.      UC Treatments / Results  Labs (all labs ordered are listed, but only abnormal results are displayed) Labs Reviewed - No data to display  EKG   Radiology No results found.  Procedures Procedures (including critical care time)  Medications Ordered in UC Medications - No data to display  Initial Impression / Assessment and Plan / UC Course  I have reviewed the triage vital signs and the nursing notes.  Pertinent labs & imaging results that were available during my care of the patient were reviewed by me and considered in my medical decision making (see chart for details).   Patient is a pleasant, nontoxic-appearing 53 year old female presenting for evaluation of sore throat and lower respiratory symptoms as outlined in HPI above.  She denies any URI symptoms.  She reports that for the last 3 weeks she has been experiencing a significant sore throat as well as fatigue and headaches.  She does have a cough that is intermittently productive for thick sputum.  She is able to speak in full sentence without dyspnea or tachypnea.  Respiratory rate at triage was 14 with 96% room air oxygen saturation.  She has never had a sleep study and denies any history of sleep apnea.  However, given her fatigue, headache, and sore throat with an upper respiratory assessment is questionable for possible sleep apnea.  I have advised her to speak with her PCP about doing a sleep study, potentially a home sleep study from [facility], to determine if she has sleep apnea as she also reports poor sleep quality..  Given that she has been experiencing symptoms for 3 weeks I do think a trial of antibiotics is warranted so I will discharge her home on cefdinir 300 mg twice daily for 7 days along with  Tessalon  Perles and Promethazine  DM cough syrup for cough and congestion.  Initially, I will prescribe an albuterol  inhaler and she can do 1 to 2 puffs every 4-6 hours as needed for cough and chest congestion.  I will also prescribe a 5-day burst dose of 60 mg prednisone  to decrease pulmonary inflammation.   Final Clinical Impressions(s) / UC Diagnoses   Final diagnoses:  Lower respiratory tract infection     Discharge Instructions      Take the cefdinir twice daily with food for 7 days to cover for potential bacterial sources of your respiratory symptoms.  Use the Tessalon  Perles every 8 hours during the day as needed for cough.  Take them with a small sip of water.  They may give you numbness to the base of your tongue, or metallic taste in your mouth, this is normal.  Use the Promethazine  DM cough syrup at bedtime as needed for cough and congestion.  When you pick up your medication I will also prescribe you prednisone  which should take your first dose when you pick up your medication.  You will take 60 mg of prednisone  daily for the next 5 days to help decrease lower respiratory inflammation and improve your breathing.  Take the medicine at breakfast time going forward.  Some your symptoms are also concerning for possible sleep apnea.  I will talk with your PCP if about ordering a sleep study.  He can order a home sleep study from [facility].  Use the albuterol  inhaler, 1 to 2 puffs every 4-6 hours, as needed for cough or chest congestion.  If you develop any new or worsening symptoms please return for reevaluation or see your PCP.     ED Prescriptions     Medication Sig Dispense Auth. Provider   albuterol  (VENTOLIN  HFA) 108 (90 Base) MCG/ACT inhaler Inhale 1-2 puffs into the lungs every 4 (four) hours as needed for wheezing or shortness of breath. 18 g Bernardino Ditch, NP   benzonatate  (TESSALON ) 100 MG capsule Take 2 capsules (200 mg total) by mouth every 8 (eight) hours.  21 capsule Bernardino Ditch, NP   promethazine -dextromethorphan (PROMETHAZINE -DM) 6.25-15 MG/5ML syrup Take 5 mLs by mouth 4 (four) times daily as needed. 118 mL Bernardino Ditch, NP   predniSONE  (DELTASONE ) 20 MG tablet Take 3 tablets (60 mg total) by mouth daily with breakfast for 5 days. 3 tablets daily for 5 days. 15 tablet Bernardino Ditch, NP   cefdinir (OMNICEF) 300 MG capsule Take 1 capsule (300 mg total) by mouth 2 (two) times daily for 7 days. 14 capsule Bernardino Ditch, NP      PDMP not reviewed this encounter.   Bernardino Ditch, NP 12/17/23 1010

## 2023-12-17 NOTE — ED Triage Notes (Signed)
 Patient states that she was treated for sinus infection 2 months ago.  Patient states that her son currently has the Flu.  Patient c/o cough and chest congestion and sore throat for 3 weeks.  Patient denies fevers.
